# Patient Record
Sex: Female | Born: 1980 | Race: White | Hispanic: No | Marital: Single | Smoking: Never smoker
Health system: Southern US, Community
[De-identification: ages and names within clinical notes are randomized; demographics above are authoritative.]

## PROBLEM LIST (undated history)

## (undated) DIAGNOSIS — F419 Anxiety disorder, unspecified: Secondary | ICD-10-CM

## (undated) DIAGNOSIS — F32A Depression, unspecified: Secondary | ICD-10-CM

## (undated) DIAGNOSIS — F429 Obsessive-compulsive disorder, unspecified: Secondary | ICD-10-CM

## (undated) DIAGNOSIS — F329 Major depressive disorder, single episode, unspecified: Secondary | ICD-10-CM

## (undated) DIAGNOSIS — D649 Anemia, unspecified: Secondary | ICD-10-CM

## (undated) HISTORY — PX: WISDOM TOOTH EXTRACTION: SHX21

## (undated) HISTORY — DX: Anxiety disorder, unspecified: F41.9

---

## 2011-09-16 ENCOUNTER — Encounter (HOSPITAL_COMMUNITY): Payer: Self-pay

## 2011-09-16 ENCOUNTER — Emergency Department (INDEPENDENT_AMBULATORY_CARE_PROVIDER_SITE_OTHER)
Admission: EM | Admit: 2011-09-16 | Discharge: 2011-09-16 | Disposition: A | Discharge status: Home / Self Care | Payer: Self-pay | Source: Home / Self Care | Attending: Emergency Medicine | Admitting: Emergency Medicine

## 2011-09-16 DIAGNOSIS — R198 Other specified symptoms and signs involving the digestive system and abdomen: Secondary | ICD-10-CM

## 2011-09-16 HISTORY — DX: Major depressive disorder, single episode, unspecified: F32.9

## 2011-09-16 HISTORY — DX: Obsessive-compulsive disorder, unspecified: F42.9

## 2011-09-16 HISTORY — DX: Depression, unspecified: F32.A

## 2011-09-16 MED ORDER — AMOXICILLIN-POT CLAVULANATE 500-125 MG PO TABS: 1.0000 | ORAL_TABLET | Freq: Two times a day (BID) | ORAL | Status: AC

## 2011-09-16 MED ORDER — IBUPROFEN 800 MG PO TABS: 800.0000 mg | ORAL_TABLET | Freq: Three times a day (TID) | ORAL | Status: AC

## 2011-09-16 NOTE — ED Notes (Signed)
C/o pain and drainage from umbilicus for 5 days.

## 2011-09-16 NOTE — ED Provider Notes (Addendum)
History     CSN: [id number]  Arrival date & time 09/16/11  1410   First MD Initiated Contact with Patient 09/16/11 1452      Chief Complaint  Patient presents with  . Rash    (Consider location/radiation/quality/duration/timing/severity/associated sxs/prior treatment) HPI Comments: Patient presents to urgent care, describing pain discharge and minor bleeding coming from her navel for about 5 days. " I have never had this happen before night cleaning my navel everyday with q-tips", it gets sore and had with a discharge coming out of it "  Patient is a 31 y.o. female presenting with rash. The history is provided by the patient.  Rash  This is a new problem. The current episode started more than 2 days ago. The problem has been gradually worsening. The problem is associated with nothing. There has been no fever. The patient is experiencing no pain. Associated symptoms include pain. Pertinent negatives include no blisters and no weeping. She has tried nothing for the symptoms. The treatment provided no relief.    Past Medical History  Diagnosis Date  . Depression   . OCD (obsessive compulsive disorder)     History reviewed. No pertinent past surgical history.  No family history on file.  History  Substance Use Topics  . Smoking status: Never Smoker   . Smokeless tobacco: Not on file  . Alcohol Use: No    OB History    Grav Para Term Preterm Abortions TAB SAB Ect Mult Living                  Review of Systems  Constitutional: Negative for fever, activity change and appetite change.  Skin: Positive for rash. Negative for wound.  Neurological: Negative for dizziness.    Allergies  Review of patient's allergies indicates no known allergies.  Home Medications   Current Outpatient Rx  Name Route Sig Dispense Refill  . FLUOXETINE HCL 40 MG PO CAPS Oral Take 40 mg by mouth daily.    . AMOXICILLIN-POT CLAVULANATE 500-125 MG PO TABS Oral Take 1 tablet (500 mg total) by  mouth 2 (two) times daily. 20 tablet 0  . IBUPROFEN 800 MG PO TABS Oral Take 1 tablet (800 mg total) by mouth 3 (three) times daily. 15 tablet 0    BP 124/83  Pulse 90  Temp 99 F (37.2 C) (Oral)  Resp 16  SpO2 100%  Physical Exam  Nursing note and vitals reviewed. Constitutional: Vital signs are normal. She appears well-developed and well-nourished.  Non-toxic appearance. She does not have a sickly appearance. She does not appear ill. No distress.  Abdominal: Soft. She exhibits no distension and no mass. There is tenderness. There is no rigidity, no rebound, no guarding, no tenderness at McBurney's point and negative Murphy's sign.    Neurological: She is alert.  Skin: No rash noted. No erythema.    ED Course  Procedures (including critical care time)  Labs Reviewed - No data to display No results found.   1. Umbilical bleeding       MDM  The umbilical localize infection without abscess.        Jimmie Molly, MD 09/16/11 1615  Jimmie Molly, MD 09/16/11 1630

## 2017-01-17 ENCOUNTER — Encounter: Payer: Self-pay | Admitting: Obstetrics and Gynecology

## 2017-01-18 ENCOUNTER — Encounter: Payer: Self-pay | Admitting: Obstetrics and Gynecology

## 2017-01-18 ENCOUNTER — Other Ambulatory Visit: Payer: Self-pay

## 2017-01-18 ENCOUNTER — Encounter (INDEPENDENT_AMBULATORY_CARE_PROVIDER_SITE_OTHER): Payer: Self-pay

## 2017-01-18 ENCOUNTER — Ambulatory Visit (INDEPENDENT_AMBULATORY_CARE_PROVIDER_SITE_OTHER): Payer: Self-pay | Admitting: Obstetrics and Gynecology

## 2017-01-18 VITALS — BP 124/86 | HR 82 | Ht 61.0 in | Wt 122.8 lb

## 2017-01-18 DIAGNOSIS — N840 Polyp of corpus uteri: Secondary | ICD-10-CM

## 2017-01-18 DIAGNOSIS — N94819 Vulvodynia, unspecified: Secondary | ICD-10-CM

## 2017-01-18 MED ORDER — GABAPENTIN 600 MG PO TABS: 600.0000 mg | ORAL_TABLET | Freq: Three times a day (TID) | ORAL | 3 refills | Status: AC

## 2017-01-18 NOTE — Patient Instructions (Signed)
Hysteroscopy  Hysteroscopy is a procedure used for looking inside the womb (uterus). It may be done for various reasons, including:  · To evaluate abnormal bleeding, fibroid (benign, noncancerous) tumors, polyps, scar tissue (adhesions), and possibly cancer of the uterus.  · To look for lumps (tumors) and other uterine growths.  · To look for causes of why a woman cannot get pregnant (infertility), causes of recurrent loss of pregnancy (miscarriages), or a lost intrauterine device (IUD).  · To perform a sterilization by blocking the fallopian tubes from inside the uterus.    In this procedure, a thin, flexible tube with a tiny light and camera on the end of it (hysteroscope) is used to look inside the uterus. A hysteroscopy should be done right after a menstrual period to be sure you are not pregnant.  LET YOUR HEALTH CARE PROVIDER KNOW ABOUT:  · Any allergies you have.  · All medicines you are taking, including vitamins, herbs, eye drops, creams, and over-the-counter medicines.  · Previous problems you or members of your family have had with the use of anesthetics.  · Any blood disorders you have.  · Previous surgeries you have had.  · Medical conditions you have.  RISKS AND COMPLICATIONS  Generally, this is a safe procedure. However, as with any procedure, complications can occur. Possible complications include:  · Putting a hole in the uterus.  · Excessive bleeding.  · Infection.  · Damage to the cervix.  · Injury to other organs.  · Allergic reaction to medicines.  · Too much fluid used in the uterus for the procedure.    BEFORE THE PROCEDURE  · Ask your health care provider about changing or stopping any regular medicines.  · Do not take aspirin or blood thinners for 1 week before the procedure, or as directed by your health care provider. These can cause bleeding.  · If you smoke, do not smoke for 2 weeks before the procedure.  · In some cases, a medicine is placed in the cervix the day before the procedure.  This medicine makes the cervix have a larger opening (dilate). This makes it easier for the instrument to be inserted into the uterus during the procedure.  · Do not eat or drink anything for at least 8 hours before the surgery.  · Arrange for someone to take you home after the procedure.  PROCEDURE  · You may be given a medicine to relax you (sedative). You may also be given one of the following:  ? A medicine that numbs the area around the cervix (local anesthetic).  ? A medicine that makes you sleep through the procedure (general anesthetic).  · The hysteroscope is inserted through the vagina into the uterus. The camera on the hysteroscope sends a picture to a TV screen. This gives the surgeon a good view inside the uterus.  · During the procedure, air or a liquid is put into the uterus, which allows the surgeon to see better.  · Sometimes, tissue is gently scraped from inside the uterus. These tissue samples are sent to a lab for testing.  What to expect after the procedure  · If you had a general anesthetic, you may be groggy for a couple hours after the procedure.  · If you had a local anesthetic, you will be able to go home as soon as you are stable and feel ready.  · You may have some cramping. This normally lasts for a couple days.  · You may   have bleeding, which varies from light spotting for a few days to menstrual-like bleeding for 3-7 days. This is normal.  · If your test results are not back during the visit, make an appointment with your health care provider to find out the results.  This information is not intended to replace advice given to you by your health care provider. Make sure you discuss any questions you have with your health care provider.  Document Released: 05/03/2000 Document Revised: 07/03/2015 Document Reviewed: 08/24/2012  Elsevier Interactive Patient Education © 2017 [employer]

## 2017-01-18 NOTE — Progress Notes (Signed)
   [facility]  @DATE @            Patient name: Patricia Wheeler MRN [id number]  Date of birth: Jan 28, 1981  CC & HPI:  Patricia Wheeler is a 36 y.o. female presenting today [facility] from the [facility] for what was felt to be a cervical polyp.  It was deemed too large to be removed at the health department  ROS:  ROS Patient has menstrual discomfort that radiates down her right thigh.  She is having the same pain after our exam  Pertinent History Reviewed:   Reviewed: Significant for medical history positive for OCD, depression and anxiety, surgical history negative other than wisdom tooth extraction Medical         Past Medical History:  Diagnosis Date  . Anxiety disorder   . Depression   . OCD (obsessive compulsive disorder)                               Surgical Hx:    Past Surgical History:  Procedure Laterality Date  . WISDOM TOOTH EXTRACTION     Medications: Reviewed & Updated - see associated section                       Current Outpatient Medications:  .  gabapentin (NEURONTIN) 600 MG tablet, Take 600 mg by mouth 3 (three) times daily., Disp: , Rfl:   Depo-Provera for contraception Social History: Reviewed -  reports that  has never smoked. she has never used smokeless tobacco.  Uses THC  Objective Findings:  Vitals: Blood pressure 124/86, pulse 82, height 5\' 1"  (1.549 m), weight 122 lb 12.8 oz (55.7 kg).  Physical Examination: General appearance - alert, well appearing, and in no distress, oriented to person, place, and time, normal appearing weight, well hydrated and anxious Mental status - alert, oriented to person, place, and time, normal mood, behavior, speech, dress, motor activity, and thought processes, affect appropriate to mood, anxious Eyes - pupils equal and reactive, extraocular eye movements intact Ears -  Abdomen - soft, nontender, nondistended, no masses or organomegaly Pelvic - normal external genitalia, vulva, vagina, cervix,  uterus and adnexa, VULVA: normal appearing vulva with no masses, tenderness or lesions, vulvar tenderness unusually small introitus unusually small introitus,, barely tolerates single digit on exam with discomfort, VAGINA: normal appearing vagina with normal color and discharge, no lesions, atrophic, CERVIX: normal appearing cervix without discharge or lesions, endocervical polyp size 1.5 cm diameter, able to be grasped and placed on traction but it originates from well up inside the uterus not tolerated by the patient adequate for removal in office Patient is too anxious and uncomfortable for paracervical block  Assessment & Plan:   A:  1.  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  Diffuse anxiety responsive to Neurontin P:  1.  Will arrange hysteroscopy D&C with removal of endometrial polyp, with exam under anesthesia at the time as she is under anesthesia 2.  We will  renew her Neurontin at current dose

## 2017-01-21 ENCOUNTER — Encounter (HOSPITAL_COMMUNITY): Payer: Self-pay

## 2017-01-21 ENCOUNTER — Other Ambulatory Visit: Payer: Self-pay

## 2017-01-21 ENCOUNTER — Encounter (HOSPITAL_COMMUNITY)
Admission: RE | Admit: 2017-01-21 | Discharge: 2017-01-21 | Disposition: A | Discharge status: Home / Self Care | Payer: Self-pay | Source: Ambulatory Visit | Attending: Obstetrics and Gynecology | Admitting: Obstetrics and Gynecology

## 2017-01-21 DIAGNOSIS — Z01812 Encounter for preprocedural laboratory examination: Secondary | ICD-10-CM | POA: Insufficient documentation

## 2017-01-21 DIAGNOSIS — N840 Polyp of corpus uteri: Secondary | ICD-10-CM | POA: Insufficient documentation

## 2017-01-21 HISTORY — DX: Anemia, unspecified: D64.9

## 2017-01-21 LAB — BASIC METABOLIC PANEL
ANION GAP: 7 (ref 5–15)
BUN: 13 mg/dL (ref 6–20)
CALCIUM: 9.2 mg/dL (ref 8.9–10.3)
CO2: 26 mmol/L (ref 22–32)
Chloride: 105 mmol/L (ref 101–111)
Creatinine, Ser: 0.51 mg/dL (ref 0.44–1.00)
Glucose, Bld: 92 mg/dL (ref 65–99)
Potassium: 3.7 mmol/L (ref 3.5–5.1)
Sodium: 138 mmol/L (ref 135–145)

## 2017-01-21 LAB — URINALYSIS, ROUTINE W REFLEX MICROSCOPIC
BILIRUBIN URINE: NEGATIVE
Glucose, UA: NEGATIVE mg/dL
Hgb urine dipstick: NEGATIVE
KETONES UR: NEGATIVE mg/dL
Leukocytes, UA: NEGATIVE
NITRITE: NEGATIVE
Protein, ur: NEGATIVE mg/dL
Specific Gravity, Urine: 1.025 (ref 1.005–1.030)
pH: 6 (ref 5.0–8.0)

## 2017-01-21 LAB — CBC
HCT: 40.4 % (ref 36.0–46.0)
Hemoglobin: 13.2 g/dL (ref 12.0–15.0)
MCH: 29.2 pg (ref 26.0–34.0)
MCHC: 32.7 g/dL (ref 30.0–36.0)
MCV: 89.4 fL (ref 78.0–100.0)
Platelets: 269 10*3/uL (ref 150–400)
RBC: 4.52 MIL/uL (ref 3.87–5.11)
RDW: 12.7 % (ref 11.5–15.5)
WBC: 6.5 10*3/uL (ref 4.0–10.5)

## 2017-01-21 LAB — HCG, SERUM, QUALITATIVE: Preg, Serum: NEGATIVE

## 2017-01-21 NOTE — Patient Instructions (Signed)
Patricia Wheeler  01/21/2017     @PREFPERIOPPHARMACY @   Your procedure is scheduled on  01/25/2017 .  [facility]  [facility].  Call this number if you have problems the morning of surgery:  [phone]   Remember:  Do not eat food or drink liquids after midnight.  Take these medicines the morning of surgery with A SIP OF WATER  Gabapentin.   Do not wear jewelry, make-up or nail polish.  Do not wear lotions, powders, or perfumes, or deodorant.  Do not shave 48 hours prior to surgery.  Men may shave face and neck.  Do not bring valuables to the hospital.  [facility] belongings or valuables.  Contacts, dentures or bridgework may not be worn into surgery.  Leave your suitcase in the car.  After surgery it may be brought to your room.  For patients admitted to the hospital, discharge time will be determined by your treatment team.  Patients discharged the day of surgery will not be allowed to drive home.   Name and phone number of your driver:   family Special instructions:  None  Please read over the following fact sheets that you were given. Anesthesia Post-op Instructions and Care and Recovery After Surgery      Hysteroscopy Hysteroscopy is a procedure used for looking inside the womb (uterus). It may be done for various reasons, including:  To evaluate abnormal bleeding, fibroid (benign, noncancerous) tumors, polyps, scar tissue (adhesions), and possibly cancer of the uterus.  To look for lumps (tumors) and other uterine growths.  To look for causes of why a woman cannot get pregnant (infertility), causes of recurrent loss of pregnancy (miscarriages), or a lost intrauterine device (IUD).  To perform a sterilization by blocking the fallopian tubes from inside the uterus.  In this procedure, a thin, flexible tube with a tiny light and camera on the end of it (hysteroscope) is used to look inside the  uterus. A hysteroscopy should be done right after a menstrual period to be sure you are not pregnant. LET [facility] KNOW ABOUT:  Any allergies you have.  All medicines you are taking, including vitamins, herbs, eye drops, creams, and over-the-counter medicines.  Previous problems you or members of your family have had with the use of anesthetics.  Any blood disorders you have.  Previous surgeries you have had.  Medical conditions you have. RISKS AND COMPLICATIONS Generally, this is a safe procedure. However, as with any procedure, complications can occur. Possible complications include:  Putting a hole in the uterus.  Excessive bleeding.  Infection.  Damage to the cervix.  Injury to other organs.  Allergic reaction to medicines.  Too much fluid used in the uterus for the procedure.  BEFORE THE PROCEDURE  Ask your health care provider about changing or stopping any regular medicines.  Do not take aspirin or blood thinners for 1 week before the procedure, or as directed by your health care provider. These can cause bleeding.  If you smoke, do not smoke for 2 weeks before the procedure.  In some cases, a medicine is placed in the cervix the day before the procedure. This medicine makes the cervix have a larger opening (dilate). This makes it easier for the instrument to be inserted into the uterus during the procedure.  Do not eat or drink anything for  at least 8 hours before the surgery.  Arrange for someone to take you home after the procedure. PROCEDURE  You may be given a medicine to relax you (sedative). You may also be given one of the following: ? A medicine that numbs the area around the cervix (local anesthetic). ? A medicine that makes you sleep through the procedure (general anesthetic).  The hysteroscope is inserted through the vagina into the uterus. The camera on the hysteroscope sends a picture to a TV screen. This gives the surgeon a good  view inside the uterus.  During the procedure, air or a liquid is put into the uterus, which allows the surgeon to see better.  Sometimes, tissue is gently scraped from inside the uterus. These tissue samples are sent to a lab for testing. What to expect after the procedure  If you had a general anesthetic, you may be groggy for a couple hours after the procedure.  If you had a local anesthetic, you will be able to go home as soon as you are stable and feel ready.  You may have some cramping. This normally lasts for a couple days.  You may have bleeding, which varies from light spotting for a few days to menstrual-like bleeding for 3-7 days. This is normal.  If your test results are not back during the visit, make an appointment with your health care provider to find out the results. This information is not intended to replace advice given to you by your health care provider. Make sure you discuss any questions you have with your health care provider. Document Released: 05/03/2000 Document Revised: 07/03/2015 Document Reviewed: 08/24/2012 Elsevier Interactive Patient Education  2017 [place]. Hysteroscopy, Care After Refer to this sheet in the next few weeks. These instructions provide you with information on caring for yourself after your procedure. Your health care provider may also give you more specific instructions. Your treatment has been planned according to current medical practices, but problems sometimes occur. Call your health care provider if you have any problems or questions after your procedure. What can I expect after the procedure? After your procedure, it is typical to have the following:  You may have some cramping. This normally lasts for a couple days.  You may have bleeding. This can vary from light spotting for a few days to menstrual-like bleeding for 3-7 days.  Follow these instructions at home:  Rest for the first 1-2 days after the procedure.  Only take  over-the-counter or prescription medicines as directed by your health care provider. Do not take aspirin. It can increase the chances of bleeding.  Take showers instead of baths for 2 weeks or as directed by your health care provider.  Do not drive for 24 hours or as directed.  Do not drink alcohol while taking pain medicine.  Do not use tampons, douche, or have sexual intercourse for 2 weeks or until your health care provider says it is okay.  Take your temperature twice a day for 4-5 days. Write it down each time.  Follow your health care provider's advice about diet, exercise, and lifting.  If you develop constipation, you may: ? Take a mild laxative if your health care provider approves. ? Add bran foods to your diet. ? Drink enough fluids to keep your urine clear or pale yellow.  Try to have someone with you or available to you for the first 24-48 hours, especially if you were given a general anesthetic.  Follow up with your  health care provider as directed. Contact a health care provider if:  You feel dizzy or lightheaded.  You feel sick to your stomach (nauseous).  You have abnormal vaginal discharge.  You have a rash.  You have pain that is not controlled with medicine. Get help right away if:  You have bleeding that is heavier than a normal menstrual period.  You have a fever.  You have increasing cramps or pain, not controlled with medicine.  You have new belly (abdominal) pain.  You pass out.  You have pain in the tops of your shoulders (shoulder strap areas).  You have shortness of breath. This information is not intended to replace advice given to you by your health care provider. Make sure you discuss any questions you have with your health care provider. Document Released: 11/15/2012 Document Revised: 07/03/2015 Document Reviewed: 08/24/2012 Elsevier Interactive Patient Education  2017 [place].  Dilation and Curettage or Vacuum  Curettage Dilation and curettage (D&C) and vacuum curettage are minor procedures. A D&C involves stretching (dilation) the cervix and scraping (curettage) the inside lining of the uterus (endometrium). During a D&C, tissue is gently scraped from the endometrium, starting from the top portion of the uterus down to the lowest part of the uterus (cervix). During a vacuum curettage, the lining and tissue in the uterus are removed with the use of gentle suction. Curettage may be performed to either diagnose or treat a problem. As a diagnostic procedure, curettage is performed to examine tissues from the uterus. A diagnostic curettage may be done if you have:  Irregular bleeding in the uterus.  Bleeding with the development of clots.  Spotting between menstrual periods.  Prolonged menstrual periods or other abnormal bleeding.  Bleeding after menopause.  No menstrual period (amenorrhea).  A change in size and shape of the uterus.  Abnormal endometrial cells discovered during a Pap test.  As a treatment procedure, curettage may be performed for the following reasons:  Removal of an IUD (intrauterine device).  Removal of retained placenta after giving birth.  Abortion.  Miscarriage.  Removal of endometrial polyps.  Removal of uncommon types of noncancerous lumps (fibroids).  Tell a health care provider about:  Any allergies you have, including allergies to prescribed medicine or latex.  All medicines you are taking, including vitamins, herbs, eye drops, creams, and over-the-counter medicines. This is especially important if you take any blood-thinning medicine. Bring a list of all of your medicines to your appointment.  Any problems you or family members have had with anesthetic medicines.  Any blood disorders you have.  Any surgeries you have had.  Your medical history and any medical conditions you have.  Whether you are pregnant or may be pregnant.  Recent vaginal  infections you have had.  Recent menstrual periods, bleeding problems you have had, and what form of birth control (contraception) you use. What are the risks? Generally, this is a safe procedure. However, problems may occur, including:  Infection.  Heavy vaginal bleeding.  Allergic reactions to medicines.  Damage to the cervix or other structures or organs.  Development of scar tissue (adhesions) inside the uterus, which can cause abnormal amounts of menstrual bleeding. This may make it harder to get pregnant in the future.  A hole (perforation) or puncture in the uterine wall. This is rare.  What happens before the procedure? Staying hydrated Follow instructions from your health care provider about hydration, which may include:  Up to 2 hours before the procedure - you  may continue to drink clear liquids, such as water, clear fruit juice, black coffee, and plain tea.  Eating and drinking restrictions Follow instructions from your health care provider about eating and drinking, which may include:  8 hours before the procedure - stop eating heavy meals or foods such as meat, fried foods, or fatty foods.  6 hours before the procedure - stop eating light meals or foods, such as toast or cereal.  6 hours before the procedure - stop drinking milk or drinks that contain milk.  2 hours before the procedure - stop drinking clear liquids. If your health care provider told you to take your medicine(s) on the day of your procedure, take them with only a sip of water.  Medicines  Ask your health care provider about: ? Changing or stopping your regular medicines. This is especially important if you are taking diabetes medicines or blood thinners. ? Taking medicines such as aspirin and ibuprofen. These medicines can thin your blood. Do not take these medicines before your procedure if your health care provider instructs you not to.  You may be given antibiotic medicine to help prevent  infection. General instructions  For 24 hours before your procedure, do not: ? Douche. ? Use tampons. ? Use medicines, creams, or suppositories in the vagina. ? Have sexual intercourse.  You may be given a pregnancy test on the day of the procedure.  Plan to have someone take you home from the hospital or clinic.  You may have a blood or urine sample taken.  If you will be going home right after the procedure, plan to have someone with you for 24 hours. What happens during the procedure?  To reduce your risk of infection: ? Your health care team will wash or sanitize their hands. ? Your skin will be washed with soap.  An IV tube will be inserted into one of your veins.  You will be given one of the following: ? A medicine that numbs the area in and around the cervix (local anesthetic). ? A medicine to make you fall asleep (general anesthetic).  You will lie down on your back, with your feet in foot rests (stirrups).  The size and position of your uterus will be checked.  A lubricated instrument (speculum or Sims retractor) will be inserted into the back side of your vagina. The speculum will be used to hold apart the walls of your vagina so your health care provider can see your cervix.  A tool (tenaculum) will be attached to the lip of the cervix to stabilize it.  Your cervix will be softened and dilated. This may be done by: ? Taking a medicine. ? Having tapered dilators or thin rods (laminaria) or gradual widening instruments (tapered dilators) inserted into your cervix.  A small, sharp, curved instrument (curette) will be used to scrape a small amount of tissue or cells from the endometrium or cervical canal. In some cases, gentle suction is applied with the curette. The curette will then be removed. The cells will be taken to a lab for testing. The procedure may vary among health care providers and hospitals. What happens after the procedure?  You may have mild  cramping, backache, pain, and light bleeding or spotting. You may pass small blood clots from your vagina.  You may have to wear compression stockings. These stockings help to prevent blood clots and reduce swelling in your legs.  Your blood pressure, heart rate, breathing rate, and blood oxygen level will  be monitored until the medicines you were given have worn off. Summary  Dilation and curettage (D&C) involves stretching (dilation) the cervix and scraping (curettage) the inside lining of the uterus (endometrium).  After the procedure, you may have mild cramping, backache, pain, and light bleeding or spotting. You may pass small blood clots from your vagina.  Plan to have someone take you home from the hospital or clinic. This information is not intended to replace advice given to you by your health care provider. Make sure you discuss any questions you have with your health care provider. Document Released: 01/25/2005 Document Revised: 10/12/2015 Document Reviewed: 10/12/2015 Elsevier Interactive Patient Education  2018 [employer].  Dilation and Curettage or Vacuum Curettage, Care After These instructions give you information about caring for yourself after your procedure. Your doctor may also give you more specific instructions. Call your doctor if you have any problems or questions after your procedure. Follow these instructions at home: Activity  Do not drive or use heavy machinery while taking prescription pain medicine.  For 24 hours after your procedure, avoid driving.  Take short walks often, followed by rest periods. Ask your doctor what activities are safe for you. After one or two days, you may be able to return to your normal activities.  Do not lift anything that is heavier than 10 lb (4.5 kg) until your doctor approves.  For at least 2 weeks, or as long as told by your doctor: ? Do not douche. ? Do not use tampons. ? Do not have sex. General instructions  Take  over-the-counter and prescription medicines only as told by your doctor. This is very important if you take blood thinning medicine.  Do not take baths, swim, or use a hot tub until your doctor approves. Take showers instead of baths.  Wear compression stockings as told by your doctor.  It is up to you to get the results of your procedure. Ask your doctor when your results will be ready.  Keep all follow-up visits as told by your doctor. This is important. Contact a doctor if:  You have very bad cramps that get worse or do not get better with medicine.  You have very bad pain in your belly (abdomen).  You cannot drink fluids without throwing up (vomiting).  You get pain in a different part of the area between your belly and thighs (pelvis).  You have bad-smelling discharge from your vagina.  You have a rash. Get help right away if:  You are bleeding a lot from your vagina. A lot of bleeding means soaking more than one sanitary pad in an hour, for 2 hours in a row.  You have clumps of blood (blood clots) coming from your vagina.  You have a fever or chills.  Your belly feels very tender or hard.  You have chest pain.  You have trouble breathing.  You cough up blood.  You feel dizzy.  You feel light-headed.  You pass out (faint).  You have pain in your neck or shoulder area. Summary  Take short walks often, followed by rest periods. Ask your doctor what activities are safe for you. After one or two days, you may be able to return to your normal activities.  Do not lift anything that is heavier than 10 lb (4.5 kg) until your doctor approves.  Do not take baths, swim, or use a hot tub until your doctor approves. Take showers instead of baths.  Contact your doctor if you have  any symptoms of infection, like bad-smelling discharge from your vagina. This information is not intended to replace advice given to you by your health care provider. Make sure you discuss any  questions you have with your health care provider. Document Released: 11/04/2007 Document Revised: 10/13/2015 Document Reviewed: 10/13/2015 Elsevier Interactive Patient Education  2017 [place].  Endometrial Ablation Endometrial ablation is a procedure that destroys the thin inner layer of the lining of the uterus (endometrium). This procedure may be done:  To stop heavy periods.  To stop bleeding that is causing anemia.  To control irregular bleeding.  To treat bleeding caused by small tumors (fibroids) in the endometrium.  This procedure is often an alternative to major surgery, such as removal of the uterus and cervix (hysterectomy). As a result of this procedure:  You may not be able to have children. However, if you are premenopausal (you have not gone through menopause): ? You may still have a small chance of getting pregnant. ? You will need to use a reliable method of birth control after the procedure to prevent pregnancy.  You may stop having a menstrual period, or you may have only a small amount of bleeding during your period. Menstruation may return several years after the procedure.  Tell a health care provider about:  Any allergies you have.  All medicines you are taking, including vitamins, herbs, eye drops, creams, and over-the-counter medicines.  Any problems you or family members have had with the use of anesthetic medicines.  Any blood disorders you have.  Any surgeries you have had.  Any medical conditions you have. What are the risks? Generally, this is a safe procedure. However, problems may occur, including:  A hole (perforation) in the uterus or bowel.  Infection of the uterus, bladder, or vagina.  Bleeding.  Damage to other structures or organs.  An air bubble in the lung (air embolus).  Problems with pregnancy after the procedure.  Failure of the procedure.  Decreased ability to diagnose cancer in the endometrium.  What happens  before the procedure?  You will have tests of your endometrium to make sure there are no pre-cancerous cells or cancer cells present.  You may have an ultrasound of the uterus.  You may be given medicines to thin the endometrium.  Ask your health care provider about: ? Changing or stopping your regular medicines. This is especially important if you take diabetes medicines or blood thinners. ? Taking medicines such as aspirin and ibuprofen. These medicines can thin your blood. Do not take these medicines before your procedure if your doctor tells you not to.  Plan to have someone take you home from the hospital or clinic. What happens during the procedure?  You will lie on an exam table with your feet and legs supported as in a pelvic exam.  To lower your risk of infection: ? Your health care team will wash or sanitize their hands and put on germ-free (sterile) gloves. ? Your genital area will be washed with soap.  An IV tube will be inserted into one of your veins.  You will be given a medicine to help you relax (sedative).  A surgical instrument with a light and camera (resectoscope) will be inserted into your vagina and moved into your uterus. This allows your surgeon to see inside your uterus.  Endometrial tissue will be removed using one of the following methods: ? Radiofrequency. This method uses a radiofrequency-alternating electric current to remove the endometrium. ? Cryotherapy. This  method uses extreme cold to freeze the endometrium. ? Heated-free liquid. This method uses a heated saltwater (saline) solution to remove the endometrium. ? Microwave. This method uses high-energy microwaves to heat up the endometrium and remove it. ? Thermal balloon. This method involves inserting a catheter with a balloon tip into the uterus. The balloon tip is filled with heated fluid to remove the endometrium. The procedure may vary among health care providers and hospitals. What happens  after the procedure?  Your blood pressure, heart rate, breathing rate, and blood oxygen level will be monitored until the medicines you were given have worn off.  As tissue healing occurs, you may notice vaginal bleeding for 4-6 weeks after the procedure. You may also experience: ? Cramps. ? Thin, watery vaginal discharge that is light pink or brown in color. ? A need to urinate more frequently than usual. ? Nausea.  Do not drive for 24 hours if you were given a sedative.  Do not have sex or insert anything into your vagina until your health care provider approves. Summary  Endometrial ablation is done to treat the many causes of heavy menstrual bleeding.  The procedure may be done only after medications have been tried to control the bleeding.  Plan to have someone take you home from the hospital or clinic. This information is not intended to replace advice given to you by your health care provider. Make sure you discuss any questions you have with your health care provider. Document Released: 12/05/2003 Document Revised: 02/12/2016 Document Reviewed: 02/12/2016 Elsevier Interactive Patient Education  2017 [place] Anesthesia, Adult General anesthesia is the use of medicines to make a person "go to sleep" (be unconscious) for a medical procedure. General anesthesia is often recommended when a procedure:  Is long.  Requires you to be still or in an unusual position.  Is major and can cause you to lose blood.  Is impossible to do without general anesthesia.  The medicines used for general anesthesia are called general anesthetics. In addition to making you sleep, the medicines:  Prevent pain.  Control your blood pressure.  Relax your muscles.  Tell a health care provider about:  Any allergies you have.  All medicines you are taking, including vitamins, herbs, eye drops, creams, and over-the-counter medicines.  Any problems you or family members have had  with anesthetic medicines.  Types of anesthetics you have had in the past.  Any bleeding disorders you have.  Any surgeries you have had.  Any medical conditions you have.  Any history of heart or lung conditions, such as heart failure, sleep apnea, or chronic obstructive pulmonary disease (COPD).  Whether you are pregnant or may be pregnant.  Whether you use tobacco, alcohol, marijuana, or street drugs.  Any history of Armed forces logistics/support/administrative officer.  Any history of depression or anxiety. What are the risks? Generally, this is a safe procedure. However, problems may occur, including:  Allergic reaction to anesthetics.  Lung and heart problems.  Inhaling food or liquids from your stomach into your lungs (aspiration).  Injury to nerves.  Waking up during your procedure and being unable to move (rare).  Extreme agitation or a state of mental confusion (delirium) when you wake up from the anesthetic.  Air in the bloodstream, which can lead to stroke.  These problems are more likely to develop if you are having a major surgery or if you have an advanced medical condition. You can prevent some of these complications by answering all  of your health care provider's questions thoroughly and by following all pre-procedure instructions. General anesthesia can cause side effects, including:  Nausea or vomiting  A sore throat from the breathing tube.  Feeling cold or shivery.  Feeling tired, washed out, or achy.  Sleepiness or drowsiness.  Confusion or agitation.  What happens before the procedure? Staying hydrated Follow instructions from your health care provider about hydration, which may include:  Up to 2 hours before the procedure - you may continue to drink clear liquids, such as water, clear fruit juice, black coffee, and plain tea.  Eating and drinking restrictions Follow instructions from your health care provider about eating and drinking, which may include:  8 hours  before the procedure - stop eating heavy meals or foods such as meat, fried foods, or fatty foods.  6 hours before the procedure - stop eating light meals or foods, such as toast or cereal.  6 hours before the procedure - stop drinking milk or drinks that contain milk.  2 hours before the procedure - stop drinking clear liquids.  Medicines  Ask your health care provider about: ? Changing or stopping your regular medicines. This is especially important if you are taking diabetes medicines or blood thinners. ? Taking medicines such as aspirin and ibuprofen. These medicines can thin your blood. Do not take these medicines before your procedure if your health care provider instructs you not to. ? Taking new dietary supplements or medicines. Do not take these during the week before your procedure unless your health care provider approves them.  If you are told to take a medicine or to continue taking a medicine on the day of the procedure, take the medicine with sips of water. General instructions   Ask if you will be going home the same day, the following day, or after a longer hospital stay. ? Plan to have someone take you home. ? Plan to have someone stay with you for the first 24 hours after you leave the hospital or clinic.  For 3-6 weeks before the procedure, try not to use any tobacco products, such as cigarettes, chewing tobacco, and e-cigarettes.  You may brush your teeth on the morning of the procedure, but make sure to spit out the toothpaste. What happens during the procedure?  You will be given anesthetics through a mask and through an IV tube in one of your veins.  You may receive medicine to help you relax (sedative).  As soon as you are asleep, a breathing tube may be used to help you breathe.  An anesthesia specialist will stay with you throughout the procedure. He or she will help keep you comfortable and safe by continuing to give you medicines and adjusting the amount  of medicine that you get. He or she will also watch your blood pressure, pulse, and oxygen levels to make sure that the anesthetics do not cause any problems.  If a breathing tube was used to help you breathe, it will be removed before you wake up. The procedure may vary among health care providers and hospitals. What happens after the procedure?  You will wake up, often slowly, after the procedure is complete, usually in a recovery area.  Your blood pressure, heart rate, breathing rate, and blood oxygen level will be monitored until the medicines you were given have worn off.  You may be given medicine to help you calm down if you feel anxious or agitated.  If you will be going home the  same day, your health care provider may check to make sure you can stand, drink, and urinate.  Your health care providers will treat your pain and side effects before you go home.  Do not drive for 24 hours if you received a sedative.  You may: ? Feel nauseous and vomit. ? Have a sore throat. ? Have mental slowness. ? Feel cold or shivery. ? Feel sleepy. ? Feel tired. ? Feel sore or achy, even in parts of your body where you did not have surgery. This information is not intended to replace advice given to you by your health care provider. Make sure you discuss any questions you have with your health care provider. Document Released: 05/04/2007 Document Revised: 07/08/2015 Document Reviewed: 01/09/2015 Elsevier Interactive Patient Education  2018 [place] Anesthesia, Adult, Care After These instructions provide you with information about caring for yourself after your procedure. Your health care provider may also give you more specific instructions. Your treatment has been planned according to current medical practices, but problems sometimes occur. Call your health care provider if you have any problems or questions after your procedure. What can I expect after the procedure? After the  procedure, it is common to have:  Vomiting.  A sore throat.  Mental slowness.  It is common to feel:  Nauseous.  Cold or shivery.  Sleepy.  Tired.  Sore or achy, even in parts of your body where you did not have surgery.  Follow these instructions at home: For at least 24 hours after the procedure:  Do not: ? Participate in activities where you could fall or become injured. ? Drive. ? Use heavy machinery. ? Drink alcohol. ? Take sleeping pills or medicines that cause drowsiness. ? Make important decisions or sign legal documents. ? Take care of children on your own.  Rest. Eating and drinking  If you vomit, drink water, juice, or soup when you can drink without vomiting.  Drink enough fluid to keep your urine clear or pale yellow.  Make sure you have little or no nausea before eating solid foods.  Follow the diet recommended by your health care provider. General instructions  Have a responsible adult stay with you until you are awake and alert.  Return to your normal activities as told by your health care provider. Ask your health care provider what activities are safe for you.  Take over-the-counter and prescription medicines only as told by your health care provider.  If you smoke, do not smoke without supervision.  Keep all follow-up visits as told by your health care provider. This is important. Contact a health care provider if:  You continue to have nausea or vomiting at home, and medicines are not helpful.  You cannot drink fluids or start eating again.  You cannot urinate after 8-12 hours.  You develop a skin rash.  You have fever.  You have increasing redness at the site of your procedure. Get help right away if:  You have difficulty breathing.  You have chest pain.  You have unexpected bleeding.  You feel that you are having a life-threatening or urgent problem. This information is not intended to replace advice given to you by your  health care provider. Make sure you discuss any questions you have with your health care provider. Document Released: 05/03/2000 Document Revised: 06/30/2015 Document Reviewed: 01/09/2015 Elsevier Interactive Patient Education  [employer].

## 2017-01-24 NOTE — H&P (Signed)
 [] Hover for details      [facility]  @[facility] @            Patient name: Patricia Wheeler     MRN [id number]  Date of birth: 1980/04/07  CC & HPI:  Patricia Wheeler is a 36 y.o. female presenting today [facility] from the [facility] for what was felt to be a cervical polyp.  It was deemed too large to be removed at the health department  ROS:  ROS Patient has menstrual discomfort that radiates down her right thigh.  She is having the same pain after our exam  Pertinent History Reviewed:   Reviewed: Significant for medical history positive for OCD, depression and anxiety, surgical history negative other than wisdom tooth extraction Medical             Past Medical History:  Diagnosis Date  . Anxiety disorder   . Depression   . OCD (obsessive compulsive disorder)                               Surgical Hx:         Past Surgical History:  Procedure Laterality Date  . WISDOM TOOTH EXTRACTION     Medications: Reviewed & Updated - see associated section                       Current Outpatient Medications:  .  gabapentin (NEURONTIN) 600 MG tablet, Take 600 mg by mouth 3 (three) times daily., Disp: , Rfl:   Depo-Provera for contraception Social History: Reviewed -  reports that  has never smoked. she has never used smokeless tobacco.  Uses THC  Objective Findings:  Vitals: Blood pressure 124/86, pulse 82, height 5\' 1"  (1.549 m), weight 122 lb 12.8 oz (55.7 kg).  Physical Examination: General appearance - alert, well appearing, and in no distress, oriented to person, place, and time, normal appearing weight, well hydrated and anxious Mental status - alert, oriented to person, place, and time, normal mood, behavior, speech, dress, motor activity, and thought processes, affect appropriate to mood, anxious Eyes - pupils equal and reactive, extraocular eye movements intact Ears -  Abdomen - soft, nontender, nondistended, no masses or organomegaly Pelvic  - normal external genitalia, vulva, vagina, cervix, uterus and adnexa, VULVA: normal appearing vulva with no masses, tenderness or lesions, vulvar tenderness unusually small introitus unusually small introitus,, barely tolerates single digit on exam with discomfort, VAGINA: normal appearing vagina with normal color and discharge, no lesions, atrophic, CERVIX: normal appearing cervix without discharge or lesions, endocervical polyp size 1.5 cm diameter, able to be grasped and placed on traction but it originates from well up inside the uterus not tolerated by the patient adequate for removal in office Patient is too anxious and uncomfortable for paracervical block  Assessment & Plan:   A:  1.  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  Diffuse anxiety responsive to Neurontin P:  1.  Will arrange hysteroscopy D&C with removal of endometrial polyp, with exam under anesthesia at the time as she is under anesthesia 2.  We will  renew her Neurontin at current dose             Electronically signed by Tilda BurrowFerguson, Donya Tomaro V, MD at 01/18/2017 2:35 PM     CBC  Component Value Date/Time   WBC 6.5 01/21/2017 1533   RBC 4.52 01/21/2017 1533   HGB 13.2 01/21/2017 1533   HCT 40.4 01/21/2017 1533   PLT 269 01/21/2017 1533   MCV 89.4 01/21/2017 1533   MCH 29.2 01/21/2017 1533   MCHC 32.7 01/21/2017 1533   RDW 12.7 01/21/2017 1533     BMET    Component Value Date/Time   NA 138 01/21/2017 1533   K 3.7 01/21/2017 1533   CL 105 01/21/2017 1533   CO2 26 01/21/2017 1533   GLUCOSE 92 01/21/2017 1533   BUN 13 01/21/2017 1533   CREATININE 0.51 01/21/2017 1533   CALCIUM 9.2 01/21/2017 1533   GFRNONAA >60 01/21/2017 1533   GFRAA >60 01/21/2017 1533    Assessment:  Endometrial polyp , prolapsing thru cervix Patient discomfort exceeds ability to perform as  outpatient  Plan:  1. Hysteroscopy dilation and curettage, removal of endometrial polyp 2. Novasure endometrial ablation.  Rationale reviewed;  Pt strongly desires ablation after review of procedure , potential for amenorrhea or dramatically reduced menses

## 2017-01-25 ENCOUNTER — Ambulatory Visit (HOSPITAL_COMMUNITY): Payer: Self-pay | Admitting: Anesthesiology

## 2017-01-25 ENCOUNTER — Encounter (HOSPITAL_COMMUNITY): Payer: Self-pay | Admitting: Anesthesiology

## 2017-01-25 ENCOUNTER — Ambulatory Visit (HOSPITAL_COMMUNITY)
Admission: RE | Admit: 2017-01-25 | Discharge: 2017-01-25 | Disposition: A | Discharge status: Home / Self Care | Payer: Self-pay | Source: Ambulatory Visit | Attending: Obstetrics and Gynecology | Admitting: Obstetrics and Gynecology

## 2017-01-25 ENCOUNTER — Encounter (HOSPITAL_COMMUNITY)
Admission: RE | Disposition: A | Discharge status: Home / Self Care | Payer: Self-pay | Source: Ambulatory Visit | Attending: Obstetrics and Gynecology

## 2017-01-25 DIAGNOSIS — F419 Anxiety disorder, unspecified: Secondary | ICD-10-CM | POA: Insufficient documentation

## 2017-01-25 DIAGNOSIS — Z79899 Other long term (current) drug therapy: Secondary | ICD-10-CM | POA: Insufficient documentation

## 2017-01-25 DIAGNOSIS — F429 Obsessive-compulsive disorder, unspecified: Secondary | ICD-10-CM | POA: Insufficient documentation

## 2017-01-25 DIAGNOSIS — N94819 Vulvodynia, unspecified: Secondary | ICD-10-CM

## 2017-01-25 DIAGNOSIS — N840 Polyp of corpus uteri: Secondary | ICD-10-CM

## 2017-01-25 DIAGNOSIS — F329 Major depressive disorder, single episode, unspecified: Secondary | ICD-10-CM | POA: Insufficient documentation

## 2017-01-25 DIAGNOSIS — D649 Anemia, unspecified: Secondary | ICD-10-CM | POA: Insufficient documentation

## 2017-01-25 HISTORY — PX: CERVICAL POLYPECTOMY: SHX88

## 2017-01-25 HISTORY — PX: DILITATION & CURRETTAGE/HYSTROSCOPY WITH NOVASURE ABLATION: SHX5568

## 2017-01-25 SURGERY — DILATATION & CURETTAGE/HYSTEROSCOPY WITH NOVASURE ABLATION
Anesthesia: General

## 2017-01-25 MED ORDER — MIDAZOLAM HCL 5 MG/5ML IJ SOLN
INTRAMUSCULAR | Status: DC | PRN
  Administered 2017-01-25: 2 mg via INTRAVENOUS

## 2017-01-25 MED ORDER — BUPIVACAINE-EPINEPHRINE (PF) 0.5% -1:200000 IJ SOLN
INTRAMUSCULAR | Status: AC
  Filled 2017-01-25: qty 30

## 2017-01-25 MED ORDER — DEXAMETHASONE SODIUM PHOSPHATE 4 MG/ML IJ SOLN
4.0000 mg | Freq: Once | INTRAMUSCULAR | Status: AC
  Administered 2017-01-25: 4 mg via INTRAVENOUS

## 2017-01-25 MED ORDER — DEXAMETHASONE SODIUM PHOSPHATE 4 MG/ML IJ SOLN
INTRAMUSCULAR | Status: AC
  Filled 2017-01-25: qty 1

## 2017-01-25 MED ORDER — ONDANSETRON HCL 4 MG/2ML IJ SOLN
4.0000 mg | Freq: Once | INTRAMUSCULAR | Status: AC
  Administered 2017-01-25: 4 mg via INTRAVENOUS

## 2017-01-25 MED ORDER — PROPOFOL 10 MG/ML IV BOLUS
INTRAVENOUS | Status: DC | PRN
  Administered 2017-01-25: 20 mg via INTRAVENOUS
  Administered 2017-01-25: 140 mg via INTRAVENOUS

## 2017-01-25 MED ORDER — BUPIVACAINE-EPINEPHRINE (PF) 0.5% -1:200000 IJ SOLN
INTRAMUSCULAR | Status: DC | PRN
  Administered 2017-01-25: 19 mL

## 2017-01-25 MED ORDER — PROPOFOL 10 MG/ML IV BOLUS
INTRAVENOUS | Status: AC
  Filled 2017-01-25: qty 20

## 2017-01-25 MED ORDER — FENTANYL CITRATE (PF) 100 MCG/2ML IJ SOLN
25.0000 ug | INTRAMUSCULAR | Status: DC | PRN
  Administered 2017-01-25 (×4): 50 ug via INTRAVENOUS
  Filled 2017-01-25 (×2): qty 2

## 2017-01-25 MED ORDER — LACTATED RINGERS IV SOLN
INTRAVENOUS | Status: DC
  Administered 2017-01-25 (×2): via INTRAVENOUS

## 2017-01-25 MED ORDER — 0.9 % SODIUM CHLORIDE (POUR BTL) OPTIME
TOPICAL | Status: DC | PRN
Start: 2017-01-25 — End: 2017-01-25
  Administered 2017-01-25: 1000 mL

## 2017-01-25 MED ORDER — FENTANYL CITRATE (PF) 100 MCG/2ML IJ SOLN
INTRAMUSCULAR | Status: DC | PRN
  Administered 2017-01-25: 100 ug via INTRAVENOUS
  Administered 2017-01-25 (×2): 50 ug via INTRAVENOUS

## 2017-01-25 MED ORDER — LIDOCAINE HCL 1 % IJ SOLN
INTRAMUSCULAR | Status: DC | PRN
  Administered 2017-01-25: 25 mg via INTRADERMAL

## 2017-01-25 MED ORDER — MIDAZOLAM HCL 2 MG/2ML IJ SOLN
1.0000 mg | INTRAMUSCULAR | Status: AC
Start: 2017-01-25 — End: 2017-01-25
  Administered 2017-01-25: 2 mg via INTRAVENOUS

## 2017-01-25 MED ORDER — KETOROLAC TROMETHAMINE 30 MG/ML IJ SOLN
INTRAMUSCULAR | Status: AC
  Filled 2017-01-25: qty 1

## 2017-01-25 MED ORDER — ONDANSETRON HCL 4 MG/2ML IJ SOLN
INTRAMUSCULAR | Status: AC
  Filled 2017-01-25: qty 2

## 2017-01-25 MED ORDER — HYDROCODONE-ACETAMINOPHEN 5-325 MG PO TABS: 1.0000 | ORAL_TABLET | Freq: Four times a day (QID) | ORAL | 0 refills | Status: AC | PRN

## 2017-01-25 MED ORDER — SODIUM CHLORIDE 0.9 % IR SOLN
Status: DC | PRN
  Administered 2017-01-25: 3000 mL

## 2017-01-25 MED ORDER — SUCCINYLCHOLINE CHLORIDE 20 MG/ML IJ SOLN
INTRAMUSCULAR | Status: AC
  Filled 2017-01-25: qty 1

## 2017-01-25 MED ORDER — KETOROLAC TROMETHAMINE 10 MG PO TABS: 10.0000 mg | ORAL_TABLET | Freq: Four times a day (QID) | ORAL | 0 refills | Status: AC | PRN

## 2017-01-25 MED ORDER — KETOROLAC TROMETHAMINE 30 MG/ML IJ SOLN
30.0000 mg | Freq: Once | INTRAMUSCULAR | Status: AC
  Administered 2017-01-25: 30 mg via INTRAVENOUS

## 2017-01-25 MED ORDER — FENTANYL CITRATE (PF) 100 MCG/2ML IJ SOLN
INTRAMUSCULAR | Status: AC
  Filled 2017-01-25: qty 4

## 2017-01-25 MED ORDER — MIDAZOLAM HCL 2 MG/2ML IJ SOLN
INTRAMUSCULAR | Status: AC
  Filled 2017-01-25: qty 2

## 2017-01-25 SURGICAL SUPPLY — 25 items
ABLATOR ENDOMETRIAL BIPOLAR (ABLATOR) ×3 IMPLANT
BAG HAMPER (MISCELLANEOUS) ×3 IMPLANT
CLOTH BEACON ORANGE TIMEOUT ST (SAFETY) ×3 IMPLANT
COVER LIGHT HANDLE STERIS (MISCELLANEOUS) ×6 IMPLANT
DECANTER SPIKE VIAL GLASS SM (MISCELLANEOUS) ×3 IMPLANT
GLOVE BIOGEL PI IND STRL 7.0 (GLOVE) ×2 IMPLANT
GLOVE BIOGEL PI IND STRL 9 (GLOVE) ×1 IMPLANT
GLOVE BIOGEL PI INDICATOR 7.0 (GLOVE) ×4
GLOVE BIOGEL PI INDICATOR 9 (GLOVE) ×2
GLOVE ECLIPSE 6.5 STRL STRAW (GLOVE) ×3 IMPLANT
GLOVE ECLIPSE 9.0 STRL (GLOVE) ×3 IMPLANT
GOWN SPEC L3 XXLG W/TWL (GOWN DISPOSABLE) ×3 IMPLANT
GOWN STRL REUS W/TWL LRG LVL3 (GOWN DISPOSABLE) ×3 IMPLANT
INST SET HYSTEROSCOPY (KITS) ×3 IMPLANT
IV NS IRRIG 3000ML ARTHROMATIC (IV SOLUTION) ×3 IMPLANT
KIT ROOM TURNOVER AP CYSTO (KITS) ×3 IMPLANT
KIT ROOM TURNOVER APOR (KITS) ×3 IMPLANT
MANIFOLD NEPTUNE II (INSTRUMENTS) ×3 IMPLANT
NS IRRIG 1000ML POUR BTL (IV SOLUTION) ×3 IMPLANT
PACK PERI GYN (CUSTOM PROCEDURE TRAY) ×3 IMPLANT
PAD ARMBOARD 7.5X6 YLW CONV (MISCELLANEOUS) ×3 IMPLANT
PAD TELFA 3X4 1S STER (GAUZE/BANDAGES/DRESSINGS) ×3 IMPLANT
SET BASIN LINEN APH (SET/KITS/TRAYS/PACK) ×3 IMPLANT
SET IRRIG Y TYPE TUR BLADDER L (SET/KITS/TRAYS/PACK) ×3 IMPLANT
SYR CONTROL 10ML LL (SYRINGE) ×3 IMPLANT

## 2017-01-25 NOTE — Discharge Instructions (Addendum)
Ketorolac injection What is this medicine? KETOROLAC (kee toe ROLE ak) is a non-steroidal anti-inflammatory drug (NSAID). It is used to treat moderate to severe pain for up to 5 days. It is commonly used after surgery. This medicine should not be used for more than 5 days. This medicine may be used for other purposes; ask your health care provider or pharmacist if you have questions. COMMON BRAND NAME(S): Toradol What should I tell my health care provider before I take this medicine? They need to know if you have any of these conditions: -asthma, especially aspirin-sensitive asthma -bleeding problems -kidney disease -stomach bleed, ulcer, or other problem -taking aspirin, other NSAID, or probenecid -an unusual or allergic reaction to ketorolac, tromethamine, aspirin, other NSAIDs, other medicines, foods, dyes or preservatives -pregnant or trying to get pregnant -breast-feeding How should I use this medicine? This medicine is for injection into a muscle or into a vein. It is given by a health care professional in a hospital or clinic setting. Talk to your pediatrician regarding the use of this medicine in children. Special care may be needed. Patients over 38 years old may have a stronger reaction and need a smaller dose. Overdosage: If you think you have taken too much of this medicine contact a poison control center or emergency room at once. NOTE: This medicine is only for you. Do not share this medicine with others. What if I miss a dose? This does not apply. What may interact with this medicine? Do not take this medicine with any of the following medications: -aspirin and aspirin-like medicines -cidofovir -methotrexate -NSAIDs, medicines for pain and inflammation, like ibuprofen or naproxen -pentoxifylline -probenecid This medicine may also interact with the following  medications: -alcohol -alendronate -alprazolam -carbamazepine -diuretics -flavocoxid -fluoxetine -ginkgo -lithium -medicines for blood pressure like enalapril -medicines that affect platelets like pentoxifylline -medicines that treat or prevent blood clots like heparin, warfarin -muscle relaxants -pemetrexed -phenytoin -thiothixene This list may not describe all possible interactions. Give your health care provider a list of all the medicines, herbs, non-prescription drugs, or dietary supplements you use. Also tell them if you smoke, drink alcohol, or use illegal drugs. Some items may interact with your medicine. What should I watch for while using this medicine? Tell your doctor or healthcare professional if your symptoms do not start to get better or if they get worse. This medicine does not prevent heart attack or stroke. In fact, this medicine may increase the chance of a heart attack or stroke. The chance may increase with longer use of this medicine and in people who have heart disease. If you take aspirin to prevent heart attack or stroke, talk with your doctor or health care professional. Do not take medicines such as ibuprofen and naproxen with this medicine. Side effects such as stomach upset, nausea, or ulcers may be more likely to occur. Many medicines available without a prescription should not be taken with this medicine. This medicine can cause ulcers and bleeding in the stomach and intestines at any time during treatment. Do not smoke cigarettes or drink alcohol. These increase irritation to your stomach and can make it more susceptible to damage from this medicine. Ulcers and bleeding can happen without warning symptoms and can cause death. This medicine can cause you to bleed more easily. Try to avoid damage to your teeth and gums when you brush or floss your teeth. What side effects may I notice from receiving this medicine? Side effects that you should report to your  doctor or health care professional as soon as possible: -allergic reactions like skin rash, itching or hives, swelling of the face, lips, or tongue -breathing problems -high blood pressure -nausea, vomiting -redness, blistering, peeling or loosening of the skin, including inside the mouth -severe stomach pain -signs and symptoms of bleeding such as bloody or black, tarry stools; red or dark-brown urine; spitting up blood or brown material that looks like coffee grounds; red spots on the skin; unusual bruising or bleeding from the eye, gums, or nose -signs and symptoms of a blood clot changes in vision; chest pain; severe, sudden headache; trouble speaking; sudden numbness or weakness of the face, arm, or leg -trouble passing urine or change in the amount of urine -unexplained weight gain or swelling -unusually weak or tired -yellowing of eyes or skin Side effects that usually do not require medical attention (report to your doctor or health care professional if they continue or are bothersome): -diarrhea -dizziness -headache -heartburn This list may not describe all possible side effects. Call your doctor for medical advice about side effects. You may report side effects to FDA at 1-800-FDA-1088. Where should I keep my medicine? This drug is given in a hospital or clinic and will not be stored at home. NOTE: This sheet is a summary. It may not cover all possible information. If you have questions about this medicine, talk to your doctor, pharmacist, or health care provider.  2018 Elsevier/Gold Standard (2016-02-04 14:38:40) Acetaminophen; Hydrocodone tablets or capsules What is this medicine? ACETAMINOPHEN; HYDROCODONE (a set a MEE noe fen; hye droe KOE done) is a pain reliever. It is used to treat moderate to severe pain. This medicine may be used for other purposes; ask your health care provider or pharmacist if you have questions. COMMON BRAND NAME(S): Anexsia, Bancap HC, Ceta-Plus,  Co-Gesic, Comfortpak, Dolagesic, [place], [address], [address], Hydrogesic, [place], Lorcet HD, Lorcet Plus, Lortab, Margesic H, Maxidone, [place], Polygesic, [place], [place], [employer], Vicodin, Vicodin ES, Vicodin HP, [place] [employer] What should I tell my health care provider before I take this medicine? They need to know if you have any of these conditions: -brain tumor -Crohn's disease, inflammatory bowel disease, or ulcerative colitis -drug abuse or addiction -head injury -heart or circulation problems -if you often drink alcohol -kidney disease or problems going to the bathroom -liver disease -lung disease, asthma, or breathing problems -an unusual or allergic reaction to acetaminophen, hydrocodone, other opioid analgesics, other medicines, foods, dyes, or preservatives -pregnant or trying to get pregnant -breast-feeding How should I use this medicine? Take this medicine by mouth with a glass of water. Follow the directions on the prescription label. You can take it with or without food. If it upsets your stomach, take it with food. Do not take your medicine more often than directed. A special MedGuide will be given to you by the pharmacist with each prescription and refill. Be sure to read this information carefully each time. Talk to your pediatrician regarding the use of this medicine in children. Special care may be needed. Overdosage: If you think you have taken too much of this medicine contact a poison control center or emergency room at once. NOTE: This medicine is only for you. Do not share this medicine with others. What if I miss a dose? If you miss a dose, take it as soon as you can. If it is almost time for your next dose, take only that dose. Do not take double or extra doses. What may interact with this medicine?  This medicine may interact with the following medications: -alcohol -antiviral medicines for HIV or AIDS -atropine -antihistamines for allergy, cough and  cold -certain antibiotics like erythromycin, clarithromycin -certain medicines for anxiety or sleep -certain medicines for bladder problems like oxybutynin, tolterodine -certain medicines for depression like amitriptyline, fluoxetine, sertraline -certain medicines for fungal infections like ketoconazole and itraconazole -certain medicines for Parkinson's disease like benztropine, trihexyphenidyl -certain medicines for seizures like carbamazepine, phenobarbital, phenytoin, primidone -certain medicines for stomach problems like dicyclomine, hyoscyamine -certain medicines for travel sickness like scopolamine -general anesthetics like halothane, isoflurane, methoxyflurane, propofol -ipratropium -local anesthetics like lidocaine, pramoxine, tetracaine -MAOIs like Carbex, Eldepryl, Marplan, Nardil, and Parnate -medicines that relax muscles for surgery -other medicines with acetaminophen -other narcotic medicines for pain or cough -phenothiazines like chlorpromazine, mesoridazine, prochlorperazine, thioridazine -rifampin This list may not describe all possible interactions. Give your health care provider a list of all the medicines, herbs, non-prescription drugs, or dietary supplements you use. Also tell them if you smoke, drink alcohol, or use illegal drugs. Some items may interact with your medicine. What should I watch for while using this medicine? Tell your doctor or health care professional if your pain does not go away, if it gets worse, or if you have new or a different type of pain. You may develop tolerance to the medicine. Tolerance means that you will need a higher dose of the medicine for pain relief. Tolerance is normal and is expected if you take the medicine for a long time. Do not suddenly stop taking your medicine because you may develop a severe reaction. Your body becomes used to the medicine. This does NOT mean you are addicted. Addiction is a behavior related to getting and using  a drug for a non-medical reason. If you have pain, you have a medical reason to take pain medicine. Your doctor will tell you how much medicine to take. If your doctor wants you to stop the medicine, the dose will be slowly lowered over time to avoid any side effects. There are different types of narcotic medicines (opiates). If you take more than one type at the same time or if you are taking another medicine that also causes drowsiness, you may have more side effects. Give your health care provider a list of all medicines you use. Your doctor will tell you how much medicine to take. Do not take more medicine than directed. Call emergency for help if you have problems breathing or unusual sleepiness. Do not take other medicines that contain acetaminophen with this medicine. Always read labels carefully. If you have questions, ask your doctor or pharmacist. If you take too much acetaminophen get medical help right away. Too much acetaminophen can be very dangerous and cause liver damage. Even if you do not have symptoms, it is important to get help right away. You may get drowsy or dizzy. Do not drive, use machinery, or do anything that needs mental alertness until you know how this medicine affects you. Do not stand or sit up quickly, especially if you are an older patient. This reduces the risk of dizzy or fainting spells. Alcohol may interfere with the effect of this medicine. Avoid alcoholic drinks. The medicine will cause constipation. Try to have a bowel movement at least every 2 to 3 days. If you do not have a bowel movement for 3 days, call your doctor or health care professional. Your mouth may get dry. Chewing sugarless gum or sucking hard candy, and drinking plenty of water may  help. Contact your doctor if the problem does not go away or is severe. What side effects may I notice from receiving this medicine? Side effects that you should report to your doctor or health care professional as soon as  possible: -allergic reactions like skin rash, itching or hives, swelling of the face, lips, or tongue -breathing problems -confusion -redness, blistering, peeling or loosening of the skin, including inside the mouth -signs and symptoms of low blood pressure like dizziness; feeling faint or lightheaded, falls; unusually weak or tired -trouble passing urine or change in the amount of urine -yellowing of the eyes or skin Side effects that usually do not require medical attention (report to your doctor or health care professional if they continue or are bothersome): -constipation -dry mouth -nausea, vomiting -tiredness This list may not describe all possible side effects. Call your doctor for medical advice about side effects. You may report side effects to FDA at 1-800-FDA-1088. Where should I keep my medicine? Keep out of the reach of children. This medicine can be abused. Keep your medicine in a safe place to protect it from theft. Do not share this medicine with anyone. Selling or giving away this medicine is dangerous and against the law. This medicine may cause accidental overdose and death if it taken by other adults, children, or pets. Mix any unused medicine with a substance like cat litter or coffee grounds. Then throw the medicine away in a sealed container like a sealed bag or a coffee can with a lid. Do not use the medicine after the expiration date. Store at room temperature between 15 and 30 degrees C (59 and 86 degrees F). NOTE: This sheet is a summary. It may not cover all possible information. If you have questions about this medicine, talk to your doctor, pharmacist, or health care provider.  2018 Elsevier/Gold Standard (2014-10-18 10:02:16) Endometrial Ablation Endometrial ablation is a procedure that destroys the thin inner layer of the lining of the uterus (endometrium). This procedure may be done:  To stop heavy periods.  To stop bleeding that is causing anemia.  To  control irregular bleeding.  To treat bleeding caused by small tumors (fibroids) in the endometrium.  This procedure is often an alternative to major surgery, such as removal of the uterus and cervix (hysterectomy). As a result of this procedure:  You may not be able to have children. However, if you are premenopausal (you have not gone through menopause): ? You may still have a small chance of getting pregnant. ? You will need to use a reliable method of birth control after the procedure to prevent pregnancy.  You may stop having a menstrual period, or you may have only a small amount of bleeding during your period. Menstruation may return several years after the procedure.  Tell a health care provider about:  Any allergies you have.  All medicines you are taking, including vitamins, herbs, eye drops, creams, and over-the-counter medicines.  Any problems you or family members have had with the use of anesthetic medicines.  Any blood disorders you have.  Any surgeries you have had.  Any medical conditions you have. What are the risks? Generally, this is a safe procedure. However, problems may occur, including:  A hole (perforation) in the uterus or bowel.  Infection of the uterus, bladder, or vagina.  Bleeding.  Damage to other structures or organs.  An air bubble in the lung (air embolus).  Problems with pregnancy after the procedure.  Failure of the  procedure.  Decreased ability to diagnose cancer in the endometrium.  What happens before the procedure?  You will have tests of your endometrium to make sure there are no pre-cancerous cells or cancer cells present.  You may have an ultrasound of the uterus.  You may be given medicines to thin the endometrium.  Ask your health care provider about: ? Changing or stopping your regular medicines. This is especially important if you take diabetes medicines or blood thinners. ? Taking medicines such as aspirin and  ibuprofen. These medicines can thin your blood. Do not take these medicines before your procedure if your doctor tells you not to.  Plan to have someone take you home from the hospital or clinic. What happens during the procedure?  You will lie on an exam table with your feet and legs supported as in a pelvic exam.  To lower your risk of infection: ? Your health care team will wash or sanitize their hands and put on germ-free (sterile) gloves. ? Your genital area will be washed with soap.  An IV tube will be inserted into one of your veins.  You will be given a medicine to help you relax (sedative).  A surgical instrument with a light and camera (resectoscope) will be inserted into your vagina and moved into your uterus. This allows your surgeon to see inside your uterus.  Endometrial tissue will be removed using one of the following methods: ? Radiofrequency. This method uses a radiofrequency-alternating electric current to remove the endometrium. ? Cryotherapy. This method uses extreme cold to freeze the endometrium. ? Heated-free liquid. This method uses a heated saltwater (saline) solution to remove the endometrium. ? Microwave. This method uses high-energy microwaves to heat up the endometrium and remove it. ? Thermal balloon. This method involves inserting a catheter with a balloon tip into the uterus. The balloon tip is filled with heated fluid to remove the endometrium. The procedure may vary among health care providers and hospitals. What happens after the procedure?  Your blood pressure, heart rate, breathing rate, and blood oxygen level will be monitored until the medicines you were given have worn off.  As tissue healing occurs, you may notice vaginal bleeding for 4-6 weeks after the procedure. You may also experience: ? Cramps. ? Thin, watery vaginal discharge that is light pink or brown in color. ? A need to urinate more frequently than usual. ? Nausea.  Do not drive  for 24 hours if you were given a sedative.  Do not have sex or insert anything into your vagina until your health care provider approves. Summary  Endometrial ablation is done to treat the many causes of heavy menstrual bleeding.  The procedure may be done only after medications have been tried to control the bleeding.  Plan to have someone take you home from the hospital or clinic. This information is not intended to replace advice given to you by your health care provider. Make sure you discuss any questions you have with your health care provider. Document Released: 12/05/2003 Document Revised: 02/12/2016 Document Reviewed: 02/12/2016 Elsevier Interactive Patient Education  2017 Elsevier Inc. PATIENT INSTRUCTIONS POST-ANESTHESIA  IMMEDIATELY FOLLOWING SURGERY:  Do not drive or operate machinery for the first twenty four hours after surgery.  Do not make any important decisions for twenty four hours after surgery or while taking narcotic pain medications or sedatives.  If you develop intractable nausea and vomiting or a severe headache please notify your doctor immediately.  FOLLOW-UP:  Please make  an appointment with your surgeon as instructed. You do not need to follow up with anesthesia unless specifically instructed to do so.  WOUND CARE INSTRUCTIONS (if applicable):  Keep a dry clean dressing on the anesthesia/puncture wound site if there is drainage.  Once the wound has quit draining you may leave it open to air.  Generally you should leave the bandage intact for twenty four hours unless there is drainage.  If the epidural site drains for more than 36-48 hours please call the anesthesia department.  QUESTIONS?:  Please feel free to call your physician or the hospital operator if you have any questions, and they will be happy to assist you.      Dilation and Curettage or Vacuum Curettage, Care After These instructions give you information about caring for yourself after your  procedure. Your doctor may also give you more specific instructions. Call your doctor if you have any problems or questions after your procedure. Follow these instructions at home: Activity  Do not drive or use heavy machinery while taking prescription pain medicine.  For 24 hours after your procedure, avoid driving.  Take short walks often, followed by rest periods. Ask your doctor what activities are safe for you. After one or two days, you may be able to return to your normal activities.  Do not lift anything that is heavier than 10 lb (4.5 kg) until your doctor approves.  For at least 2 weeks, or as long as told by your doctor: ? Do not douche. ? Do not use tampons. ? Do not have sex. General instructions  Take over-the-counter and prescription medicines only as told by your doctor. This is very important if you take blood thinning medicine.  Do not take baths, swim, or use a hot tub until your doctor approves. Take showers instead of baths.  Wear compression stockings as told by your doctor.  It is up to you to get the results of your procedure. Ask your doctor when your results will be ready.  Keep all follow-up visits as told by your doctor. This is important. Contact a doctor if:  You have very bad cramps that get worse or do not get better with medicine.  You have very bad pain in your belly (abdomen).  You cannot drink fluids without throwing up (vomiting).  You get pain in a different part of the area between your belly and thighs (pelvis).  You have bad-smelling discharge from your vagina.  You have a rash. Get help right away if:  You are bleeding a lot from your vagina. A lot of bleeding means soaking more than one sanitary pad in an hour, for 2 hours in a row.  You have clumps of blood (blood clots) coming from your vagina.  You have a fever or chills.  Your belly feels very tender or hard.  You have chest pain.  You have trouble breathing.  You  cough up blood.  You feel dizzy.  You feel light-headed.  You pass out (faint).  You have pain in your neck or shoulder area. Summary  Take short walks often, followed by rest periods. Ask your doctor what activities are safe for you. After one or two days, you may be able to return to your normal activities.  Do not lift anything that is heavier than 10 lb (4.5 kg) until your doctor approves.  Do not take baths, swim, or use a hot tub until your doctor approves. Take showers instead of baths.  Contact  your doctor if you have any symptoms of infection, like bad-smelling discharge from your vagina. This information is not intended to replace advice given to you by your health care provider. Make sure you discuss any questions you have with your health care provider. Document Released: 11/04/2007 Document Revised: 10/13/2015 Document Reviewed: 10/13/2015 Elsevier Interactive Patient Education  2017 [employer].

## 2017-01-25 NOTE — Anesthesia Postprocedure Evaluation (Signed)
Anesthesia Post Note  Patient: Patricia Wheeler  Procedure(s) Performed: DILATATION, HYSTEROSCOPY WITH NOVASURE ENDOMETRIAL ABLATION (N/A ) ENDOMETRIAL POLYPECTOMY (N/A )  Patient location during evaluation: PACU Anesthesia Type: General Level of consciousness: awake and patient cooperative Pain management: pain level controlled Vital Signs Assessment: post-procedure vital signs reviewed and stable Respiratory status: spontaneous breathing Cardiovascular status: blood pressure returned to baseline Postop Assessment: no apparent nausea or vomiting Anesthetic complications: no     Last Vitals:  Vitals:   01/25/17 0725 01/25/17 0730  BP:  114/81  Pulse:    Resp: 19 (!) 25  Temp:    SpO2: 98% 98%    Last Pain:  Vitals:   01/25/17 0649  TempSrc: Oral                 [name] J

## 2017-01-25 NOTE — Transfer of Care (Signed)
Immediate Anesthesia Transfer of Care Note  Patient: Patricia Wheeler  Procedure(s) Performed: DILATATION, HYSTEROSCOPY WITH NOVASURE ENDOMETRIAL ABLATION (N/A ) ENDOMETRIAL POLYPECTOMY (N/A )  Patient Location: PACU  Anesthesia Type:General  Level of Consciousness: awake and patient cooperative  Airway & Oxygen Therapy: Patient Spontanous Breathing and Patient connected to face mask oxygen  Post-op Assessment: Report given to RN, Post -op Vital signs reviewed and stable and Patient moving all extremities  Post vital signs: Reviewed and stable  Last Vitals:  Vitals:   01/25/17 0725 01/25/17 0730  BP:  114/81  Pulse:    Resp: 19 (!) 25  Temp:    SpO2: 98% 98%    Last Pain:  Vitals:   01/25/17 0649  TempSrc: Oral      Patients Stated Pain Goal: 3 (01/25/17 0649)  Complications: No apparent anesthesia complications

## 2017-01-25 NOTE — Brief Op Note (Signed)
01/25/2017  8:56 AM  PATIENT:  Patricia Wheeler  36 y.o. female  PRE-OPERATIVE DIAGNOSIS:  Endometrial Polyp Vulvodynia  POST-OPERATIVE DIAGNOSIS:  Endometrial Polyp Vulvodynia  PROCEDURE:  Procedure(s): DILATATION, HYSTEROSCOPY WITH NOVASURE ENDOMETRIAL ABLATION (N/A) ENDOMETRIAL POLYPECTOMY (N/A)  SURGEON:  Surgeon(s) and Role:    Tilda Burrow* Sparrow Sanzo V, MD - Primary  PHYSICIAN ASSISTANT:   ASSISTANTS: none   ANESTHESIA:   general and paracervical block  EBL:  10 mL   BLOOD ADMINISTERED:none  DRAINS: none   LOCAL MEDICATIONS USED:  MARCAINE    and Amount: 20 ml  SPECIMEN:  Source of Specimen:  endometrial polyp  DISPOSITION OF SPECIMEN:  PATHOLOGY  COUNTS:  YES  TOURNIQUET:  * No tourniquets in log *  DICTATION: .Dragon Dictation  PLAN OF CARE: Discharge to home after PACU  PATIENT DISPOSITION:  PACU - hemodynamically stable.   Delay start of Pharmacological VTE agent (>24hrs) due to surgical blood loss or risk of bleeding: not applicable Christin BachJohn Sueo Cullen dictating on [place]. [place].  Details of procedure: Patient was taken to the operating room prepped and draped for a vaginal procedure with timeout conducted and confirmed by operative team.  Speculum was inserted a normal sized Graves speculum with slight difficulty due to patient's narrow introitus.  Cervix was identified and the polyp protruding from the cervix just barely.  Single-tooth tenaculum was used to grasp the cervix at 12:00 and paracervical block applied using 20 cc of Marcaine solution.  The hysteroscope was prepared and inserted beside the polyp which had dilated the cervix is sufficient that a 23 mm dilator could be inserted without any resistance.  The hysteroscope identified a thin atrophic endometrial cavity and a endometrial polyp protruding from the lower uterine segment.  The hysteroscopic scissors were then used to amputate the polyp off at its base and this was extracted.  Repeat hysteroscopy  showed that there was no other abnormalities on the endometrial cavity.  There was no need for curettage.   Endometrial ablation device was then prepared the uterus sounded to 4 cm beyond the  lower uterine segment, and then the ablation device prepared.  Settings were 4 cm in length and 3.0 cm with.  The ablation sequence was conducted, after testing for integrity of the endometrial cavity, and the 2.0-minute sequence completed without any difficulty. Hysteroscopy equipment was removed, perineum inspected.  There was some snugness in the introitus and some cracking and squint of the skin at the posterior fourchette just from the manipulation necessary to perform the procedure.  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  Patient went to recovery room in good condition will receive Toradol and Vicodin prescriptions for discharge home.  copy to family tree

## 2017-01-25 NOTE — Anesthesia Procedure Notes (Signed)
Procedure Name: LMA Insertion Date/Time: 01/25/2017 7:44 AM Performed by: Despina HiddenIdacavage, Genevive Printup J, CRNA Pre-anesthesia Checklist: Patient identified, Patient being monitored, Emergency Drugs available, Timeout performed and Suction available Patient Re-evaluated:Patient Re-evaluated prior to induction Oxygen Delivery Method: Circle System Utilized Preoxygenation: Pre-oxygenation with 100% oxygen Induction Type: IV induction Ventilation: Mask ventilation without difficulty LMA: LMA inserted LMA Size: 4.0 and 3.0 Number of attempts: 1 Placement Confirmation: positive ETCO2 and breath sounds checked- equal and bilateral Tube secured with: Tape Dental Injury: Teeth and Oropharynx as per pre-operative assessment

## 2017-01-25 NOTE — Anesthesia Preprocedure Evaluation (Signed)
Anesthesia Evaluation  Patient identified by MRN, date of birth, ID band Patient awake    Reviewed: Allergy & Precautions, NPO status , Patient's Chart, lab work & pertinent test results  Airway Mallampati: II  TM Distance: >3 FB     Dental  (+) Teeth Intact   Pulmonary neg pulmonary ROS,    breath sounds clear to auscultation       Cardiovascular negative cardio ROS   Rhythm:Regular Rate:Normal     Neuro/Psych PSYCHIATRIC DISORDERS (OCD) Anxiety Depression negative neurological ROS     GI/Hepatic negative GI ROS, (+)     substance abuse  marijuana use,   Endo/Other  negative endocrine ROS  Renal/GU negative Renal ROS     Musculoskeletal negative musculoskeletal ROS (+)   Abdominal   Peds  Hematology  (+) anemia ,   Anesthesia Other Findings   Reproductive/Obstetrics                             Anesthesia Physical Anesthesia Plan  ASA: II  Anesthesia Plan: General   Post-op Pain Management:    Induction: Intravenous  PONV Risk Score and Plan:   Airway Management Planned: LMA  Additional Equipment:   Intra-op Plan:   Post-operative Plan: Extubation in OR  Informed Consent: I have reviewed the patients History and Physical, chart, labs and discussed the procedure including the risks, benefits and alternatives for the proposed anesthesia with the patient or authorized representative who has indicated his/her understanding and acceptance.     Plan Discussed with:   Anesthesia Plan Comments:         Anesthesia Quick Evaluation

## 2017-01-25 NOTE — Interval H&P Note (Signed)
History and Physical Interval Note:  01/25/2017 7:25 AM  Patricia Wheeler  has presented today for surgery, with the diagnosis of Endometrial Polyp. She also requests endometrial ablation to eliminate or reduce future menses, which is currently controlled by Depo Provera.  The various methods of treatment have been discussed with the patient and family. After consideration of risks, benefits and other options for treatment, the patient has consented to  Procedure(s): DILATATION & CURETTAGE/HYSTEROSCOPY WITH NOVASURE ABLATION (N/A) ENDOMETRIAL POLYPECTOMY (N/A) as a surgical intervention .  The patient's history has been reviewed, patient examined, no change in status, stable for surgery.  I have reviewed the patient's chart and labs.  Questions were answered to the patient's satisfaction.   CBC    Component Value Date/Time   WBC 6.5 01/21/2017 1533   RBC 4.52 01/21/2017 1533   HGB 13.2 01/21/2017 1533   HCT 40.4 01/21/2017 1533   PLT 269 01/21/2017 1533   MCV 89.4 01/21/2017 1533   MCH 29.2 01/21/2017 1533   MCHC 32.7 01/21/2017 1533   RDW 12.7 01/21/2017 1533     Patricia Wheeler

## 2017-01-25 NOTE — Op Note (Signed)
Please see the brief operative note for surgical details 

## 2017-01-26 ENCOUNTER — Encounter (HOSPITAL_COMMUNITY): Payer: Self-pay | Admitting: Obstetrics and Gynecology

## 2017-02-02 ENCOUNTER — Telehealth: Payer: Self-pay | Admitting: Obstetrics and Gynecology

## 2017-02-02 ENCOUNTER — Encounter: Payer: Self-pay | Admitting: Obstetrics and Gynecology

## 2017-02-02 NOTE — Telephone Encounter (Signed)
Pt forgot appt. Pt doing fine after the first 2 days of cramping. Pt at a new job today day 1. Pt aware of benign biopsy results. Followup prn.

## 2017-11-02 ENCOUNTER — Emergency Department (HOSPITAL_COMMUNITY): Payer: Medicaid - Out of State

## 2017-11-02 ENCOUNTER — Emergency Department (HOSPITAL_COMMUNITY)
Admission: EM | Admit: 2017-11-02 | Discharge: 2017-11-02 | Disposition: A | Discharge status: Home / Self Care | Payer: Medicaid - Out of State | Attending: Emergency Medicine | Admitting: Emergency Medicine

## 2017-11-02 ENCOUNTER — Other Ambulatory Visit: Payer: Self-pay

## 2017-11-02 ENCOUNTER — Encounter (HOSPITAL_COMMUNITY): Payer: Self-pay | Admitting: Emergency Medicine

## 2017-11-02 DIAGNOSIS — R509 Fever, unspecified: Secondary | ICD-10-CM | POA: Diagnosis present

## 2017-11-02 DIAGNOSIS — Z79899 Other long term (current) drug therapy: Secondary | ICD-10-CM | POA: Insufficient documentation

## 2017-11-02 DIAGNOSIS — M791 Myalgia, unspecified site: Secondary | ICD-10-CM | POA: Diagnosis not present

## 2017-11-02 LAB — COMPREHENSIVE METABOLIC PANEL
ALBUMIN: 3.2 g/dL — AB (ref 3.5–5.0)
ALT: 12 U/L (ref 0–44)
ANION GAP: 12 (ref 5–15)
AST: 21 U/L (ref 15–41)
Alkaline Phosphatase: 56 U/L (ref 38–126)
BUN: 5 mg/dL — AB (ref 6–20)
CHLORIDE: 102 mmol/L (ref 98–111)
CO2: 23 mmol/L (ref 22–32)
Calcium: 8.8 mg/dL — ABNORMAL LOW (ref 8.9–10.3)
Creatinine, Ser: 0.57 mg/dL (ref 0.44–1.00)
GFR calc Af Amer: >60 mL/min (ref >60)
GFR calc non Af Amer: >60 mL/min (ref >60)
GLUCOSE: 108 mg/dL — AB (ref 70–99)
POTASSIUM: 3.4 mmol/L — AB (ref 3.5–5.1)
SODIUM: 137 mmol/L (ref 135–145)
Total Bilirubin: 0.4 mg/dL (ref 0.3–1.2)
Total Protein: 7.3 g/dL (ref 6.5–8.1)

## 2017-11-02 LAB — CBC WITH DIFFERENTIAL/PLATELET
ABS IMMATURE GRANULOCYTES: 0 10*3/uL (ref 0.0–0.1)
Basophils Absolute: 0 10*3/uL (ref 0.0–0.1)
Basophils Relative: 0 %
EOS PCT: 2 %
Eosinophils Absolute: 0.2 10*3/uL (ref 0.0–0.7)
HCT: 37.3 % (ref 36.0–46.0)
HEMOGLOBIN: 12.3 g/dL (ref 12.0–15.0)
Immature Granulocytes: 0 %
LYMPHS PCT: 12 %
Lymphs Abs: 1.1 10*3/uL (ref 0.7–4.0)
MCH: 30.3 pg (ref 26.0–34.0)
MCHC: 33 g/dL (ref 30.0–36.0)
MCV: 91.9 fL (ref 78.0–100.0)
MONO ABS: 0.3 10*3/uL (ref 0.1–1.0)
Monocytes Relative: 4 %
NEUTROS ABS: 7.6 10*3/uL (ref 1.7–7.7)
Neutrophils Relative %: 82 %
Platelets: 359 10*3/uL (ref 150–400)
RBC: 4.06 MIL/uL (ref 3.87–5.11)
RDW: 12.3 % (ref 11.5–15.5)
WBC: 9.2 10*3/uL (ref 4.0–10.5)

## 2017-11-02 LAB — URINALYSIS, ROUTINE W REFLEX MICROSCOPIC
BILIRUBIN URINE: NEGATIVE
GLUCOSE, UA: NEGATIVE mg/dL
HGB URINE DIPSTICK: NEGATIVE
KETONES UR: 20 mg/dL — AB
Leukocytes, UA: NEGATIVE
NITRITE: NEGATIVE
PROTEIN: 30 mg/dL — AB
Specific Gravity, Urine: 1.029 (ref 1.005–1.030)
pH: 6 (ref 5.0–8.0)

## 2017-11-02 LAB — I-STAT CG4 LACTIC ACID, ED: LACTIC ACID, VENOUS: 1.38 mmol/L (ref 0.5–1.9)

## 2017-11-02 LAB — I-STAT BETA HCG BLOOD, ED (MC, WL, AP ONLY): I-stat hCG, quantitative: <5 m[IU]/mL (ref <5)

## 2017-11-02 MED ORDER — ACETAMINOPHEN 325 MG PO TABS
650.0000 mg | ORAL_TABLET | Freq: Once | ORAL | Status: AC | PRN
  Administered 2017-11-02: 650 mg via ORAL
  Filled 2017-11-02: qty 2

## 2017-11-02 MED ORDER — SODIUM CHLORIDE 0.9 % IV BOLUS
1000.0000 mL | Freq: Once | INTRAVENOUS | Status: AC
  Administered 2017-11-02: 1000 mL via INTRAVENOUS

## 2017-11-02 MED ORDER — IBUPROFEN 400 MG PO TABS
600.0000 mg | ORAL_TABLET | Freq: Once | ORAL | Status: AC
  Administered 2017-11-02: 600 mg via ORAL
  Filled 2017-11-02: qty 1

## 2017-11-02 NOTE — ED Notes (Signed)
Patient Alert and oriented to baseline. Stable and ambulatory to baseline. Patient verbalized understanding of the discharge instructions.  Patient belongings were taken by the patient.   

## 2017-11-02 NOTE — ED Triage Notes (Signed)
Pt c/o fever, generalized body aches, and not eating x 1 week.  States her sister is in ICU for "pneumonia from vaping."  Pt states the doctor told her to come here to be seen because she may have the same thing as her sister.  Reports smoking "THC pens".

## 2017-11-02 NOTE — Discharge Instructions (Signed)
It is not felt that you have pneumonia, and your fever and aches are likely a viral illness requiring supportive care. Push fluids at home. Take ibuprofen and/or Tylenol for aches and any further fever. Follow up with your doctor as needed if symptoms persist.

## 2017-11-02 NOTE — ED Provider Notes (Signed)
[facility] EMERGENCY DEPARTMENT Provider Note   CSN: [id number] Arrival date & time: 11/02/17  0059     History   Chief Complaint Chief Complaint  Patient presents with  . Fever  . Generalized Body Aches    HPI Patricia Wheeler is a 37 y.o. female.  Patient presents to the ED with concern for having developed pneumonia. She states she has had a generalized body aches and a "high" fever for the past one week that returns after Tylenol wears off. She does not have a significant cough but has had some minimal nasal congestive symptoms. She states he twin sister is in the ICU with pneumonia secondary to vaping. She went to her doctor's office this morning where a chest x-ray was performed showing possible infiltrates. She reports that she does not vape or smoke cigarettes but does use 'THC pens' so felt she was at risk. No vomiting. She reports her doctor also did a flu test that was reported to her as negative.   The history is provided by the patient. No language interpreter was used.  Fever   Associated symptoms include congestion (minimal). Pertinent negatives include no chest pain, no diarrhea, no vomiting and no cough.    Past Medical History:  Diagnosis Date  . Anemia   . Anxiety disorder   . Depression   . OCD (obsessive compulsive disorder)     Patient Active Problem List   Diagnosis Date Noted  . Vulvodynia 01/25/2017  . Endometrial polyp 01/18/2017    Past Surgical History:  Procedure Laterality Date  . CERVICAL POLYPECTOMY N/A 01/25/2017   Procedure: ENDOMETRIAL POLYPECTOMY;  Surgeon: Tilda Burrow, MD;  Location: [facility];  Service: Gynecology;  Laterality: N/A;  . DILITATION & CURRETTAGE/HYSTROSCOPY WITH NOVASURE ABLATION N/A 01/25/2017   Procedure: DILATATION, HYSTEROSCOPY WITH NOVASURE ENDOMETRIAL ABLATION;  Surgeon: Tilda Burrow, MD;  Location: [facility];  Service: Gynecology;  Laterality: N/A;  . WISDOM TOOTH EXTRACTION       OB  History    Gravida  0   Para  0   Term  0   Preterm  0   AB  0   Living  0     SAB  0   TAB  0   Ectopic  0   Multiple  0   Live Births  0            Home Medications    Prior to Admission medications   Medication Sig Start Date End Date Taking? Authorizing Provider  gabapentin (NEURONTIN) 600 MG tablet Take 1 tablet (600 mg total) by mouth 3 (three) times daily. Patient taking differently: Take 600 mg by mouth 3 (three) times daily as needed (for nerve pain).  01/18/17  Yes Tilda Burrow, MD  HYDROcodone-acetaminophen (NORCO/VICODIN) 5-325 MG tablet Take 1 tablet by mouth every 6 (six) hours as needed for moderate pain. May take with ibuprofen Patient not taking: Reported on 11/02/2017 01/25/17   Tilda Burrow, MD  ketorolac (TORADOL) 10 MG tablet Take 1 tablet (10 mg total) by mouth every 6 (six) hours as needed (five day limit postop). Patient not taking: Reported on 11/02/2017 01/25/17   Tilda Burrow, MD    Family History Family History  Problem Relation Age of Onset  . Breast cancer Maternal Grandmother   . Cancer Father        B cell lymphoma  . Breast cancer Sister   . Diabetes Sister  Social History Social History   Tobacco Use  . Smoking status: Never Smoker  . Smokeless tobacco: Never Used  Substance Use Topics  . Alcohol use: No  . Drug use: Yes    Frequency: 1.0 times per week    Types: Marijuana    Comment: Adderall     Allergies   Patient has no known allergies.   Review of Systems Review of Systems  Constitutional: Positive for appetite change and fever.  HENT: Positive for congestion (minimal).   Respiratory: Negative for cough and shortness of breath.   Cardiovascular: Negative for chest pain.  Gastrointestinal: Negative for diarrhea and vomiting.  Musculoskeletal: Positive for myalgias.  Neurological: Negative for weakness.     Physical Exam Updated Vital Signs BP 122/63 (BP Location: Right Arm)   Pulse  80   Temp (S) (!) 100.5 F (38.1 C) (Oral)   Resp 16   SpO2 100%   Physical Exam  Constitutional: She is oriented to person, place, and time. She appears well-developed and well-nourished.  HENT:  Head: Normocephalic.  Neck: Normal range of motion. Neck supple.  Cardiovascular: Normal rate and regular rhythm.  Pulmonary/Chest: Effort normal and breath sounds normal. She has no wheezes. She has no rales.  Abdominal: Soft. Bowel sounds are normal. There is no tenderness. There is no rebound and no guarding.  Musculoskeletal: Normal range of motion.  Neurological: She is alert and oriented to person, place, and time.  Skin: Skin is warm and dry. No rash noted.  Psychiatric: She has a normal mood and affect.  Nursing note and vitals reviewed.    ED Treatments / Results  Labs (all labs ordered are listed, but only abnormal results are displayed) Labs Reviewed  COMPREHENSIVE METABOLIC PANEL - Abnormal; Notable for the following components:      Result Value   Potassium 3.4 (*)    Glucose, Bld 108 (*)    BUN 5 (*)    Calcium 8.8 (*)    Albumin 3.2 (*)    All other components within normal limits  URINALYSIS, ROUTINE W REFLEX MICROSCOPIC - Abnormal; Notable for the following components:   APPearance HAZY (*)    Ketones, ur 20 (*)    Protein, ur 30 (*)    Bacteria, UA RARE (*)    All other components within normal limits  CBC WITH DIFFERENTIAL/PLATELET  I-STAT CG4 LACTIC ACID, ED  I-STAT BETA HCG BLOOD, ED ([facility], [facility], [facility] ONLY)  I-STAT CG4 LACTIC ACID, ED   Results for orders placed or performed during the hospital encounter of 11/02/17  Comprehensive metabolic panel  Result Value Ref Range   Sodium 137 135 - 145 mmol/L   Potassium 3.4 (L) 3.5 - 5.1 mmol/L   Chloride 102 98 - 111 mmol/L   CO2 23 22 - 32 mmol/L   Glucose, Bld 108 (H) 70 - 99 mg/dL   BUN 5 (L) 6 - 20 mg/dL   Creatinine, Ser 0.45 0.44 - 1.00 mg/dL   Calcium 8.8 (L) 8.9 - 10.3 mg/dL   Total Protein 7.3 6.5 -  8.1 g/dL   Albumin 3.2 (L) 3.5 - 5.0 g/dL   AST 21 15 - 41 U/L   ALT 12 0 - 44 U/L   Alkaline Phosphatase 56 38 - 126 U/L   Total Bilirubin 0.4 0.3 - 1.2 mg/dL   GFR calc non Af Amer >60 >60 mL/min   GFR calc Af Amer >60 >60 mL/min   Anion gap 12 5 - 15  CBC with Differential  Result Value Ref Range   WBC 9.2 4.0 - 10.5 K/uL   RBC 4.06 3.87 - 5.11 MIL/uL   Hemoglobin 12.3 12.0 - 15.0 g/dL   HCT 16.1 09.6 - 04.5 %   MCV 91.9 78.0 - 100.0 fL   MCH 30.3 26.0 - 34.0 pg   MCHC 33.0 30.0 - 36.0 g/dL   RDW 40.9 81.1 - 91.4 %   Platelets 359 150 - 400 K/uL   Neutrophils Relative % 82 %   Neutro Abs 7.6 1.7 - 7.7 K/uL   Lymphocytes Relative 12 %   Lymphs Abs 1.1 0.7 - 4.0 K/uL   Monocytes Relative 4 %   Monocytes Absolute 0.3 0.1 - 1.0 K/uL   Eosinophils Relative 2 %   Eosinophils Absolute 0.2 0.0 - 0.7 K/uL   Basophils Relative 0 %   Basophils Absolute 0.0 0.0 - 0.1 K/uL   Immature Granulocytes 0 %   Abs Immature Granulocytes 0.0 0.0 - 0.1 K/uL  Urinalysis, Routine w reflex microscopic  Result Value Ref Range   Color, Urine YELLOW YELLOW   APPearance HAZY (A) CLEAR   Specific Gravity, Urine 1.029 1.005 - 1.030   pH 6.0 5.0 - 8.0   Glucose, UA NEGATIVE NEGATIVE mg/dL   Hgb urine dipstick NEGATIVE NEGATIVE   Bilirubin Urine NEGATIVE NEGATIVE   Ketones, ur 20 (A) NEGATIVE mg/dL   Protein, ur 30 (A) NEGATIVE mg/dL   Nitrite NEGATIVE NEGATIVE   Leukocytes, UA NEGATIVE NEGATIVE   RBC / HPF 0-5 0 - 5 RBC/hpf   WBC, UA 0-5 0 - 5 WBC/hpf   Bacteria, UA RARE (A) NONE SEEN   Squamous Epithelial / LPF 0-5 0 - 5   Mucus PRESENT   I-Stat CG4 Lactic Acid, ED  Result Value Ref Range   Lactic Acid, Venous 1.38 0.5 - 1.9 mmol/L  I-Stat beta hCG blood, ED  Result Value Ref Range   I-stat hCG, quantitative <5.0 <5 mIU/mL   Comment 3            EKG None  Radiology Dg Chest 2 View  Result Date: 11/02/2017 CLINICAL DATA:  37 year old female with body ache anterior. EXAM: CHEST - 2  VIEW COMPARISON:  None. FINDINGS: Minimal bibasilar densities, likely atelectatic changes. No focal consolidation, pleural effusion, or pneumothorax. The cardiac silhouette is within normal limits. No acute osseous pathology. IMPRESSION: No focal consolidation. Electronically Signed   By: Elgie Collard M.D.   On: 11/02/2017 01:52    Procedures Procedures (including critical care time)  Medications Ordered in ED Medications  acetaminophen (TYLENOL) tablet 650 mg (650 mg Oral Given 11/02/17 0132)  sodium chloride 0.9 % bolus 1,000 mL (1,000 mLs Intravenous New Bag/Given 11/02/17 0446)  ibuprofen (ADVIL,MOTRIN) tablet 600 mg (600 mg Oral Given 11/02/17 0506)     Initial Impression / Assessment and Plan / ED Course  I have reviewed the triage vital signs and the nursing notes.  Pertinent labs & imaging results that were available during my care of the patient were reviewed by me and considered in my medical decision making (see chart for details).     Patient presents with concern that she has contracted the same pneumonia that her sister is in intensive care for after vaping. She was told by her doctor that he chest x-ray was negative and she needed to come here for further evaluation.   CXr here is negative for infiltrates or consolidation. Likely atx. VSS, low grade temperature while  here. No hypoxia, tachycardia or tachypnea. Patient reassured. IVF's provided.   She can be discharged home with supportive care.   Final Clinical Impressions(s) / ED Diagnoses   Final diagnoses:  None   1. Febrile illness 2. myalgia  ED Discharge Orders    None       Danne Harbor 11/02/17 1610    Nira Conn, MD 11/02/17 [phone]

## 2017-11-02 NOTE — ED Notes (Signed)
ED Provider at bedside. 

## 2018-01-16 ENCOUNTER — Ambulatory Visit: Payer: Self-pay | Admitting: Obstetrics and Gynecology

## 2018-01-16 ENCOUNTER — Encounter (INDEPENDENT_AMBULATORY_CARE_PROVIDER_SITE_OTHER): Payer: Self-pay

## 2019-11-12 IMAGING — DX DG CHEST 2V
2 series · 2 of 2 positions shown · non-contrast
Comparison: None.

CLINICAL DATA: 37-year-old female with body ache anterior.

EXAM:
CHEST - 2 VIEW

[chest pa]
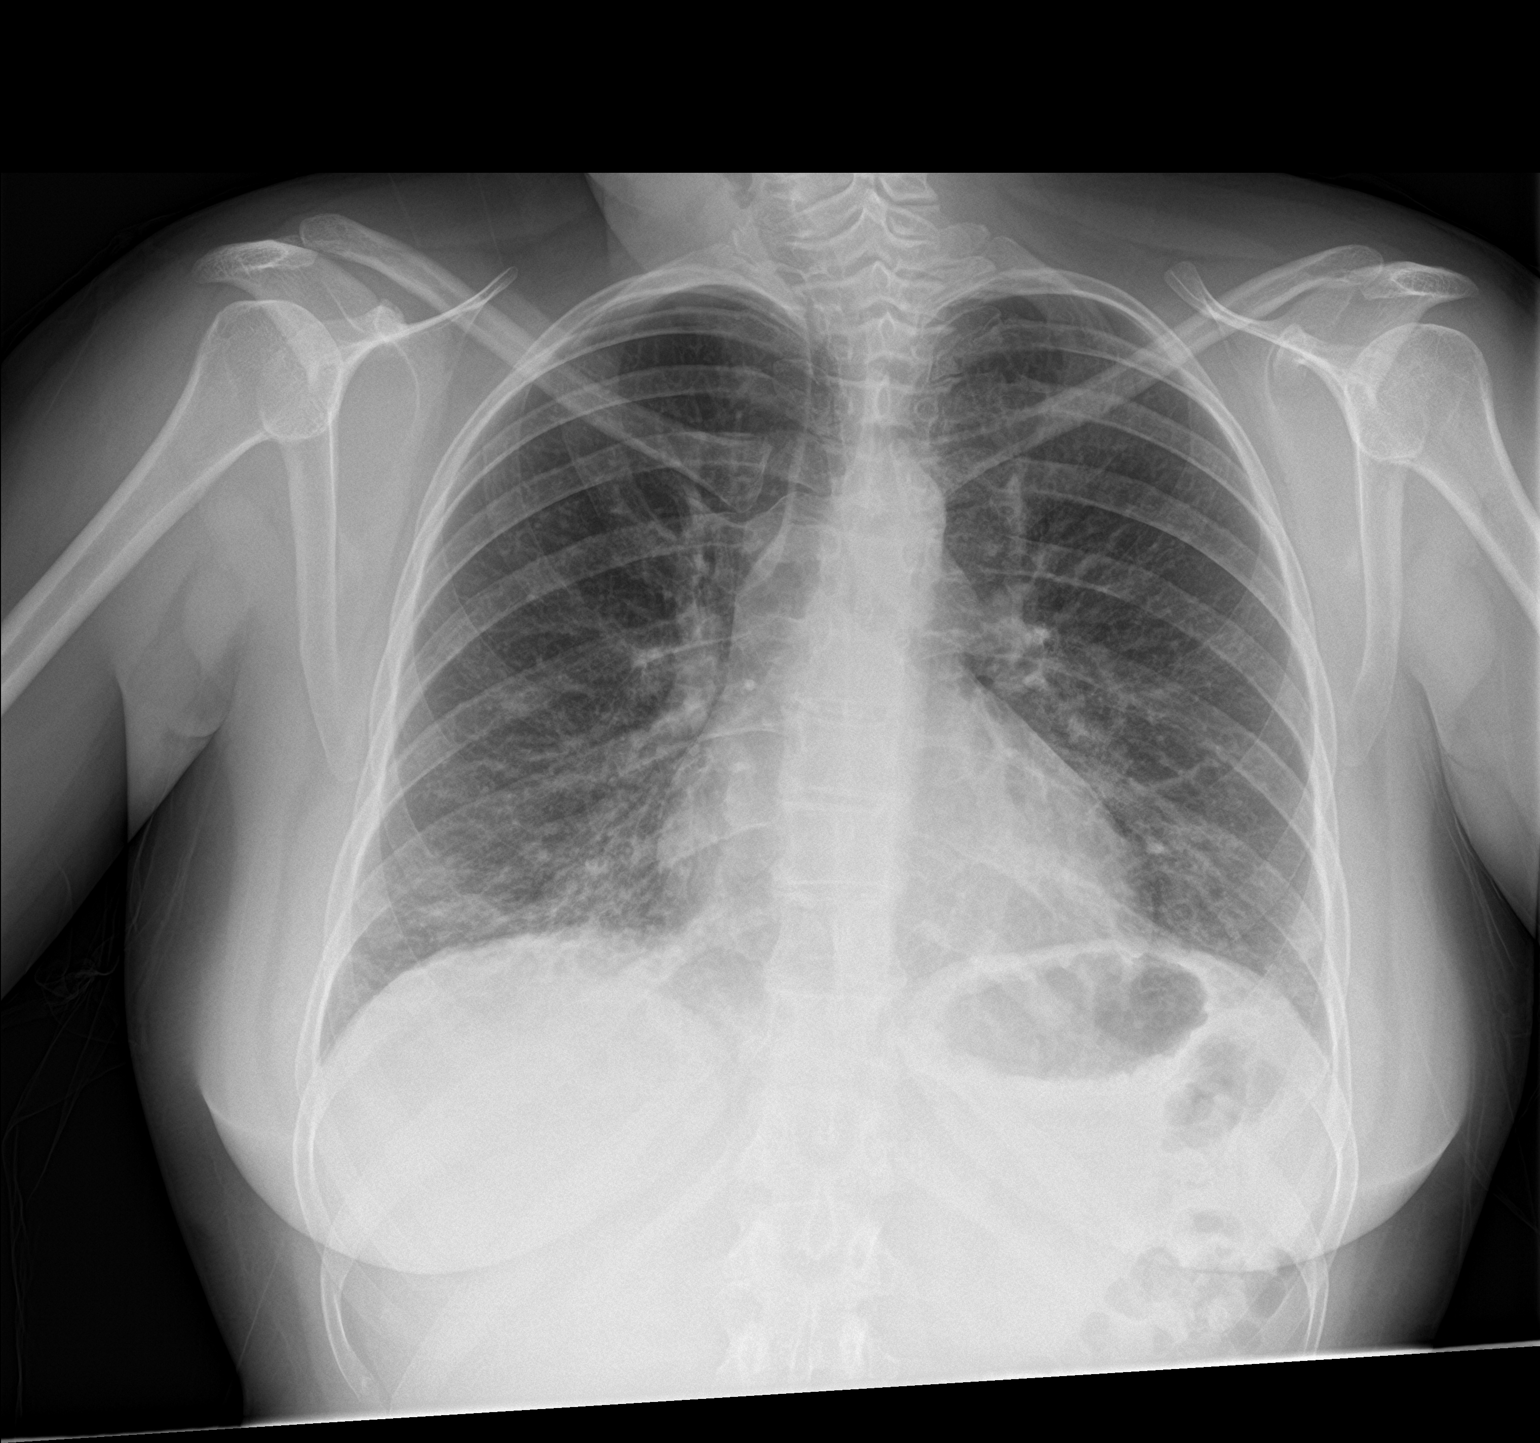

[chest lat]
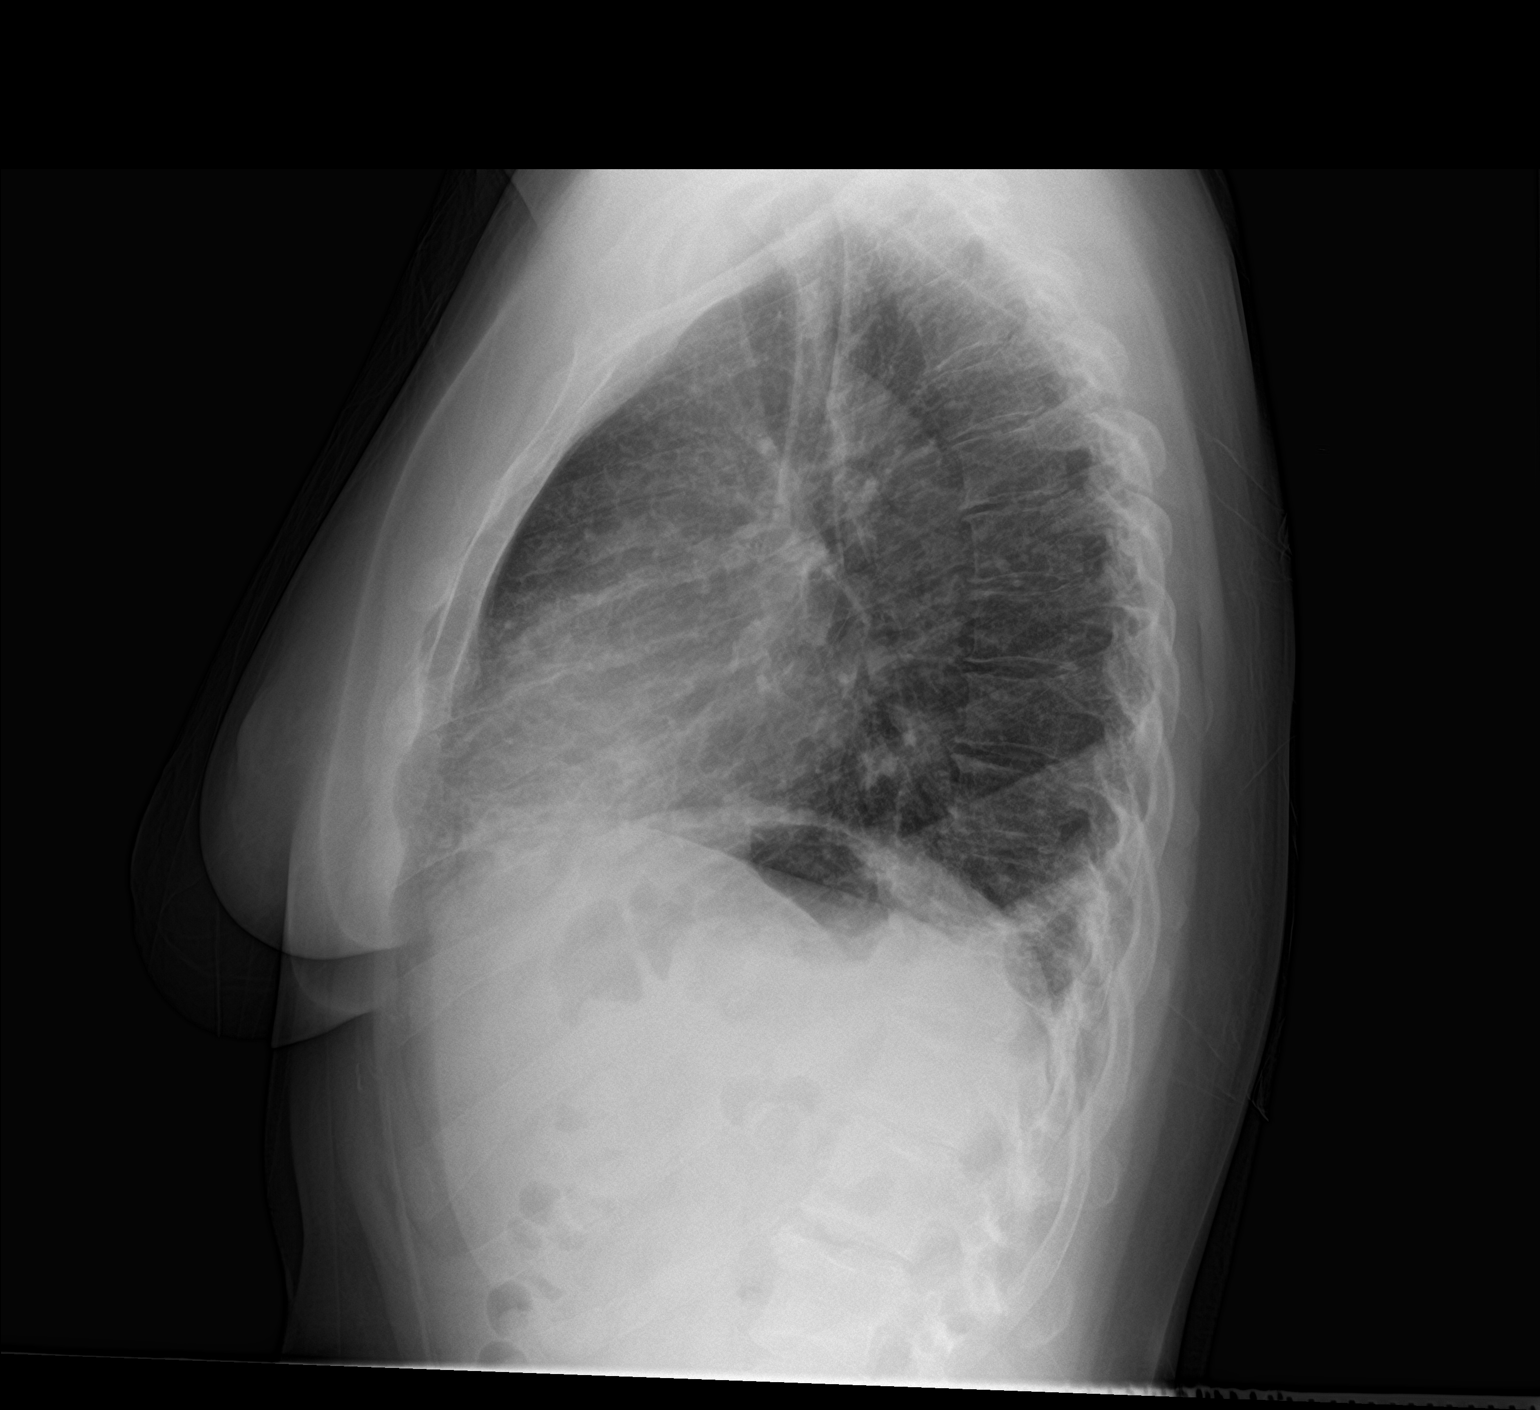

[2 of 2 positions shown; findings below may reference images not displayed]

FINDINGS: Minimal bibasilar densities, likely atelectatic changes. No focal
consolidation, pleural effusion, or pneumothorax. The cardiac
silhouette is within normal limits. No acute osseous pathology.
IMPRESSION: No focal consolidation.

## 2020-11-06 ENCOUNTER — Encounter: Payer: Medicaid - Out of State | Admitting: Adult Health

## 2020-11-10 ENCOUNTER — Encounter: Payer: Medicaid Other | Admitting: Adult Health
